# Patient Record
Sex: Female | Born: 1935 | Race: White | Hispanic: No | Smoking: Former smoker
Health system: Southern US, Community
[De-identification: ages and names within clinical notes are randomized; demographics above are authoritative.]

## PROBLEM LIST (undated history)

## (undated) DIAGNOSIS — I4891 Unspecified atrial fibrillation: Secondary | ICD-10-CM

## (undated) DIAGNOSIS — I10 Essential (primary) hypertension: Secondary | ICD-10-CM

## (undated) DIAGNOSIS — I509 Heart failure, unspecified: Secondary | ICD-10-CM

## (undated) HISTORY — PX: APPENDECTOMY: SHX54

## (undated) HISTORY — PX: PACEMAKER INSERTION: SHX728

## (undated) HISTORY — PX: TONSILLECTOMY: SUR1361

---

## 2013-04-05 ENCOUNTER — Encounter: Payer: Self-pay | Admitting: Podiatry

## 2013-04-05 ENCOUNTER — Ambulatory Visit (INDEPENDENT_AMBULATORY_CARE_PROVIDER_SITE_OTHER): Payer: Medicare Other | Admitting: Podiatry

## 2013-04-05 VITALS — BP 131/82 | HR 75 | Ht 60.0 in | Wt 134.0 lb

## 2013-04-05 DIAGNOSIS — L97509 Non-pressure chronic ulcer of other part of unspecified foot with unspecified severity: Secondary | ICD-10-CM | POA: Insufficient documentation

## 2013-04-05 DIAGNOSIS — M79609 Pain in unspecified limb: Secondary | ICD-10-CM

## 2013-04-05 DIAGNOSIS — B351 Tinea unguium: Secondary | ICD-10-CM

## 2013-04-05 DIAGNOSIS — L84 Corns and callosities: Secondary | ICD-10-CM

## 2013-04-05 DIAGNOSIS — M2040 Other hammer toe(s) (acquired), unspecified foot: Secondary | ICD-10-CM

## 2013-04-05 DIAGNOSIS — L97521 Non-pressure chronic ulcer of other part of left foot limited to breakdown of skin: Secondary | ICD-10-CM

## 2013-04-05 NOTE — Progress Notes (Signed)
79 year old female presents complaining of pain on 2nd toe left due to overlapping with the first toe and corn on top.  Objective: Digital corn pre ulcerative 2nd toe left. Overlapping 1st and 2nd digit left, 2nd digit sitting on top of the first. Severe Hallux valgus with bunion deformity on left. Mild hypertrophic nails x 10. Painful calluses under the 2nd MPJ area bilateral.  Assessment: Pre ulcerative digital corn 2nd left with dorsal erythema. Severe hammer toe 2nd left. Severe HAV with bunion left. Overlapping digits 1st and 2nd left. Pain in closed in shoes.  Plan: Digital lesions debrided and padded. Plantar calluses debrided. Hypertrophic nails debrided. May benefit from tenotomy of the 2nd digit left.

## 2013-04-05 NOTE — Patient Instructions (Signed)
Seen for painful toe 2nd left, and nails and calluses. All lesions debrided and padded 2nd toe left. May benefit from tenotomy of the 2nd toe left.

## 2017-01-31 ENCOUNTER — Emergency Department (HOSPITAL_BASED_OUTPATIENT_CLINIC_OR_DEPARTMENT_OTHER)
Admission: EM | Admit: 2017-01-31 | Discharge: 2017-01-31 | Disposition: A | Discharge status: Home / Self Care | Payer: Medicare Other | Attending: Emergency Medicine | Admitting: Emergency Medicine

## 2017-01-31 ENCOUNTER — Encounter (HOSPITAL_BASED_OUTPATIENT_CLINIC_OR_DEPARTMENT_OTHER): Payer: Self-pay | Admitting: Emergency Medicine

## 2017-01-31 DIAGNOSIS — I110 Hypertensive heart disease with heart failure: Secondary | ICD-10-CM | POA: Diagnosis not present

## 2017-01-31 DIAGNOSIS — I509 Heart failure, unspecified: Secondary | ICD-10-CM | POA: Diagnosis not present

## 2017-01-31 DIAGNOSIS — R040 Epistaxis: Secondary | ICD-10-CM | POA: Diagnosis not present

## 2017-01-31 DIAGNOSIS — Z87891 Personal history of nicotine dependence: Secondary | ICD-10-CM | POA: Diagnosis not present

## 2017-01-31 HISTORY — DX: Heart failure, unspecified: I50.9

## 2017-01-31 HISTORY — DX: Essential (primary) hypertension: I10

## 2017-01-31 HISTORY — DX: Unspecified atrial fibrillation: I48.91

## 2017-01-31 MED ORDER — SILVER NITRATE-POT NITRATE 75-25 % EX MISC
CUTANEOUS | Status: AC
  Filled 2017-01-31: qty 1

## 2017-01-31 MED ORDER — SILVER NITRATE-POT NITRATE 75-25 % EX MISC
1.0000 "application " | Freq: Once | CUTANEOUS | Status: AC
  Administered 2017-01-31: 1 via TOPICAL
  Filled 2017-01-31: qty 1

## 2017-01-31 NOTE — ED Provider Notes (Signed)
[facility]   CSN: [id number] Arrival date & time: 01/31/17  1746     History   Chief Complaint Chief Complaint  Patient presents with  . Epistaxis    HPI Miranda Krueger is a 83 y.o. female.  HPI  83yo female with history of atrial fibrillation on xarelto, CHF, htn presents with concern for epistaxis. Has hx of epistaxis from left nare before and has seen ENT, had silver nitrate cautery performed. Wears O2 at night and believes this is contributor. This episode started at [facility]. Reports initially significant amount of bleeding to the area, it has slowed now.  Tried afrin and constant pressure for 10 minutes without improvement, nose still bleeding from left nare. No trauma  Past Medical History:  Diagnosis Date  . A-fib (HCC)   . CHF (congestive heart failure) (HCC)   . Hypertension     Patient Active Problem List   Diagnosis Date Noted  . Other hammer toe (acquired) 04/05/2013  . Corns and callosities 04/05/2013  . Onychomycosis 04/05/2013  . Ulcer of toe (HCC) 04/05/2013    Past Surgical History:  Procedure Laterality Date  . APPENDECTOMY    . PACEMAKER INSERTION    . TONSILLECTOMY      OB History    No data available       Home Medications    Prior to Admission medications   Medication Sig Start Date End Date Taking? Authorizing Provider  UNKNOWN TO PATIENT    Yes [provider]    Family History No family history on file.  Social History Social History  Substance Use Topics  . Smoking status: Former Games developer  . Smokeless tobacco: Never Used  . Alcohol use Not on file     Allergies   Amaryl [glimepiride]   Review of Systems Review of Systems  Constitutional: Negative for fever.  HENT: Positive for nosebleeds.   Respiratory: Negative for shortness of breath.   Gastrointestinal: Negative for vomiting.     Physical Exam Updated Vital Signs BP 139/83   Pulse 73   Temp 98.3 F (36.8 C) (Oral)   Resp  18   SpO2 93%   Physical Exam  Constitutional: She is oriented to person, place, and time. She appears well-developed and well-nourished. No distress.  HENT:  Head: Normocephalic and atraumatic.  Mouth/Throat: No oropharyngeal exudate.  Bleeding from left nare kiesselbach's, small amount of bleeding on inspection and spots of blood when patient dabbing nose  Eyes: Conjunctivae and EOM are normal.  Neck: Normal range of motion.  Cardiovascular: Normal rate, regular rhythm and intact distal pulses.   Pulmonary/Chest: Effort normal and breath sounds normal. No respiratory distress.  Abdominal: Soft. She exhibits no distension. There is no tenderness. There is no guarding.  Musculoskeletal: She exhibits no edema or tenderness.  Neurological: She is alert and oriented to person, place, and time.  Skin: Skin is warm and dry. No rash noted. She is not diaphoretic. No erythema.  Nursing note and vitals reviewed.    ED Treatments / Results  Labs (all labs ordered are listed, but only abnormal results are displayed) Labs Reviewed - No data to display  EKG  EKG Interpretation None       Radiology No results found.  Procedures .Epistaxis Management Date/Time: 02/01/2017 2:27 PM Performed by: Alvira Monday Authorized by: Alvira Monday   Consent:    Consent obtained:  Verbal   Consent given by:  Patient   Risks discussed:  Pain, nasal  injury and bleeding   Alternatives discussed: pressure, packing. Anesthesia (see MAR for exact dosages):    Anesthesia method:  None Procedure details:    Treatment site:  L anterior   Treatment method:  Silver nitrate   Treatment complexity:  Limited   Treatment episode: initial   Post-procedure details:    Assessment:  Bleeding decreased   Patient tolerance of procedure:  Tolerated well, no immediate complications   (including critical care time)  Medications Ordered in ED Medications  silver nitrate applicators applicator 1  application (1 application Topical Given by Other 01/31/17 1900)     Initial Impression / Assessment and Plan / ED Course  I have reviewed the triage vital signs and the nursing notes.  Pertinent labs & imaging results that were available during my care of the patient were reviewed by me and considered in my medical decision making (see chart for details).     83yo female with history of atrial fibrillation on xarelto, CHF, htn presents with concern for epistaxis.  Patient with very mild epistaxis on arrival to ED, however given she had done afrin/pressure at home discussed options including repeat pressure or silver nitrate. Patient preferred silver nitrate.  Treated with improvement, small area of bleed noted to be present.  Placed pressure over area. On reevaluation it has now stopped bleeding and patient discharged. Discussed epistaxis mangement, gave nose clips, recommend continued afrin and pressure prn.  Final Clinical Impressions(s) / ED Diagnoses   Final diagnoses:  Epistaxis    New Prescriptions Discharge Medication List as of 01/31/2017  7:16 PM       Alvira Monday, MD 02/01/17 1434

## 2017-01-31 NOTE — ED Notes (Signed)
Patient reports taking blood pressure medicine. Patient reports forgetting to take it this morning.

## 2017-01-31 NOTE — ED Notes (Addendum)
Patient called this RN into the room. States that she is bleeding again. She reports that she is not "happy" because she is still having bleeding to her nose. Nasal Clamp reapplied and patient waiting for MD to come back to the room to see her

## 2017-01-31 NOTE — ED Notes (Addendum)
Patient reports blood thinner use at home. Patient reports 4 nosebleeds over the past 6-7 weeks. Patient reports seeing ENT for past three nosebleeds to have vessel cauterized. Patient reports "feeling like blood going back down the throat." Patient reports using 2L of Oxygen [place] at night at home and feels "like it dries her nose out."

## 2017-01-31 NOTE — ED Notes (Signed)
Previous notes at this time documented in error

## 2017-01-31 NOTE — ED Notes (Signed)
Nose clamp applied to nose. Pt's SpO2 88% while pt breathing through mouth. Pt wears O2 at night. Blow by O2 applied and brought SpO2 up to 96%.

## 2017-01-31 NOTE — ED Triage Notes (Signed)
Pt reports nosebleed since 3pm. Bleeding has stopped at this time.

## 2017-11-16 ENCOUNTER — Emergency Department (HOSPITAL_COMMUNITY): Payer: Medicare Other

## 2017-11-16 ENCOUNTER — Inpatient Hospital Stay (HOSPITAL_COMMUNITY)
Admission: EM | Admit: 2017-11-16 | Discharge: 2017-12-16 | DRG: 481 | Disposition: E | Payer: Medicare Other | Attending: Family Medicine | Admitting: Family Medicine

## 2017-11-16 ENCOUNTER — Encounter (HOSPITAL_COMMUNITY): Payer: Self-pay | Admitting: Internal Medicine

## 2017-11-16 DIAGNOSIS — Z79899 Other long term (current) drug therapy: Secondary | ICD-10-CM | POA: Diagnosis not present

## 2017-11-16 DIAGNOSIS — I480 Paroxysmal atrial fibrillation: Secondary | ICD-10-CM | POA: Diagnosis present

## 2017-11-16 DIAGNOSIS — Y92009 Unspecified place in unspecified non-institutional (private) residence as the place of occurrence of the external cause: Secondary | ICD-10-CM

## 2017-11-16 DIAGNOSIS — Z7901 Long term (current) use of anticoagulants: Secondary | ICD-10-CM | POA: Diagnosis not present

## 2017-11-16 DIAGNOSIS — D649 Anemia, unspecified: Secondary | ICD-10-CM | POA: Diagnosis present

## 2017-11-16 DIAGNOSIS — I509 Heart failure, unspecified: Secondary | ICD-10-CM | POA: Diagnosis present

## 2017-11-16 DIAGNOSIS — S72001A Fracture of unspecified part of neck of right femur, initial encounter for closed fracture: Secondary | ICD-10-CM

## 2017-11-16 DIAGNOSIS — I959 Hypotension, unspecified: Secondary | ICD-10-CM | POA: Diagnosis not present

## 2017-11-16 DIAGNOSIS — Z8679 Personal history of other diseases of the circulatory system: Secondary | ICD-10-CM

## 2017-11-16 DIAGNOSIS — I110 Hypertensive heart disease with heart failure: Secondary | ICD-10-CM | POA: Diagnosis present

## 2017-11-16 DIAGNOSIS — E785 Hyperlipidemia, unspecified: Secondary | ICD-10-CM | POA: Diagnosis present

## 2017-11-16 DIAGNOSIS — Z8673 Personal history of transient ischemic attack (TIA), and cerebral infarction without residual deficits: Secondary | ICD-10-CM

## 2017-11-16 DIAGNOSIS — J8410 Pulmonary fibrosis, unspecified: Secondary | ICD-10-CM | POA: Diagnosis present

## 2017-11-16 DIAGNOSIS — I495 Sick sinus syndrome: Secondary | ICD-10-CM | POA: Diagnosis present

## 2017-11-16 DIAGNOSIS — W19XXXA Unspecified fall, initial encounter: Secondary | ICD-10-CM

## 2017-11-16 DIAGNOSIS — Z888 Allergy status to other drugs, medicaments and biological substances status: Secondary | ICD-10-CM

## 2017-11-16 DIAGNOSIS — Z882 Allergy status to sulfonamides status: Secondary | ICD-10-CM

## 2017-11-16 DIAGNOSIS — J9611 Chronic respiratory failure with hypoxia: Secondary | ICD-10-CM | POA: Diagnosis present

## 2017-11-16 DIAGNOSIS — Z66 Do not resuscitate: Secondary | ICD-10-CM | POA: Diagnosis present

## 2017-11-16 DIAGNOSIS — W01198A Fall on same level from slipping, tripping and stumbling with subsequent striking against other object, initial encounter: Secondary | ICD-10-CM | POA: Diagnosis present

## 2017-11-16 DIAGNOSIS — Z950 Presence of cardiac pacemaker: Secondary | ICD-10-CM

## 2017-11-16 DIAGNOSIS — J9690 Respiratory failure, unspecified, unspecified whether with hypoxia or hypercapnia: Secondary | ICD-10-CM

## 2017-11-16 DIAGNOSIS — Z9981 Dependence on supplemental oxygen: Secondary | ICD-10-CM | POA: Diagnosis not present

## 2017-11-16 DIAGNOSIS — S72141A Displaced intertrochanteric fracture of right femur, initial encounter for closed fracture: Secondary | ICD-10-CM

## 2017-11-16 DIAGNOSIS — D696 Thrombocytopenia, unspecified: Secondary | ICD-10-CM | POA: Diagnosis present

## 2017-11-16 DIAGNOSIS — S72009A Fracture of unspecified part of neck of unspecified femur, initial encounter for closed fracture: Secondary | ICD-10-CM

## 2017-11-16 DIAGNOSIS — Z87891 Personal history of nicotine dependence: Secondary | ICD-10-CM

## 2017-11-16 DIAGNOSIS — M25551 Pain in right hip: Secondary | ICD-10-CM | POA: Diagnosis present

## 2017-11-16 DIAGNOSIS — Z8249 Family history of ischemic heart disease and other diseases of the circulatory system: Secondary | ICD-10-CM

## 2017-11-16 DIAGNOSIS — Z419 Encounter for procedure for purposes other than remedying health state, unspecified: Secondary | ICD-10-CM

## 2017-11-16 LAB — BASIC METABOLIC PANEL
ANION GAP: 9 (ref 5–15)
BUN: 11 mg/dL (ref 8–23)
CALCIUM: 8.7 mg/dL — AB (ref 8.9–10.3)
CO2: 26 mmol/L (ref 22–32)
CREATININE: 0.85 mg/dL (ref 0.44–1.00)
Chloride: 99 mmol/L (ref 98–111)
GFR, EST NON AFRICAN AMERICAN: 59 mL/min — AB (ref >60)
Glucose, Bld: 136 mg/dL — ABNORMAL HIGH (ref 70–99)
Potassium: 4 mmol/L (ref 3.5–5.1)
SODIUM: 134 mmol/L — AB (ref 135–145)

## 2017-11-16 LAB — CBC WITH DIFFERENTIAL/PLATELET
ABS IMMATURE GRANULOCYTES: 0.1 10*3/uL (ref 0.0–0.1)
BASOS ABS: 0 10*3/uL (ref 0.0–0.1)
BASOS PCT: 0 %
EOS ABS: 0.1 10*3/uL (ref 0.0–0.7)
Eosinophils Relative: 1 %
HCT: 35.5 % — ABNORMAL LOW (ref 36.0–46.0)
Hemoglobin: 11.6 g/dL — ABNORMAL LOW (ref 12.0–15.0)
IMMATURE GRANULOCYTES: 1 %
Lymphocytes Relative: 11 %
Lymphs Abs: 1 10*3/uL (ref 0.7–4.0)
MCH: 33.3 pg (ref 26.0–34.0)
MCHC: 32.7 g/dL (ref 30.0–36.0)
MCV: 102 fL — ABNORMAL HIGH (ref 78.0–100.0)
MONOS PCT: 7 %
Monocytes Absolute: 0.7 10*3/uL (ref 0.1–1.0)
NEUTROS ABS: 7.7 10*3/uL (ref 1.7–7.7)
NEUTROS PCT: 80 %
PLATELETS: 148 10*3/uL — AB (ref 150–400)
RBC: 3.48 MIL/uL — AB (ref 3.87–5.11)
RDW: 12.6 % (ref 11.5–15.5)
WBC: 9.6 10*3/uL (ref 4.0–10.5)

## 2017-11-16 LAB — PROTIME-INR
INR: 1.49
PROTHROMBIN TIME: 17.9 s — AB (ref 11.4–15.2)

## 2017-11-16 MED ORDER — FENTANYL CITRATE (PF) 100 MCG/2ML IJ SOLN
12.5000 ug | INTRAMUSCULAR | Status: AC | PRN
  Administered 2017-11-16 (×2): 12.5 ug via INTRAVENOUS
  Filled 2017-11-16 (×2): qty 2

## 2017-11-16 MED ORDER — HYDROMORPHONE HCL 1 MG/ML IJ SOLN
0.5000 mg | INTRAMUSCULAR | Status: DC | PRN
  Administered 2017-11-17: 0.5 mg via INTRAVENOUS
  Filled 2017-11-16: qty 1

## 2017-11-16 MED ORDER — SODIUM CHLORIDE 0.9 % IV SOLN
INTRAVENOUS | Status: DC
  Administered 2017-11-16 – 2017-11-17 (×2): via INTRAVENOUS

## 2017-11-16 MED ORDER — ONDANSETRON HCL 4 MG/2ML IJ SOLN
4.0000 mg | Freq: Once | INTRAMUSCULAR | Status: AC
Start: 2017-11-16 — End: 2017-11-16
  Administered 2017-11-16: 4 mg via INTRAVENOUS
  Filled 2017-11-16: qty 2

## 2017-11-16 NOTE — ED Notes (Signed)
ED Provider at bedside. 

## 2017-11-16 NOTE — ED Provider Notes (Signed)
[facility] Provider Note   CSN: [id number] Arrival date & time: 12/12/2017  1847     History   Chief Complaint Chief Complaint  Patient presents with  . Fall  . Leg Injury    HPI Miranda Krueger is a 84 y.o. female.  HPI Presents after fall. Patient seems to recall the fall, but is unclear on some of the events itself. She is accompanied by 2 family members. She acknowledges multiple medical issues including A. fib, takes Xarelto. Reportedly the patient lost balance, fell striking her head against the wall, and then onto the ground. Is unclear if she lost consciousness.  When she does have some pain about the right occiput, but no neck pain, no weakness in her extremities. She does have pain in the right hip, worse with motion and she has not been ambulatory since the fall. Medication provided by EMS, fentanyl improve her condition transiently. Past Medical History:  Diagnosis Date  . A-fib (HCC)   . CHF (congestive heart failure) (HCC)   . Hypertension     Patient Active Problem List   Diagnosis Date Noted  . Other hammer toe (acquired) 04/05/2013  . Corns and callosities 04/05/2013  . Onychomycosis 04/05/2013  . Ulcer of toe (HCC) 04/05/2013    Past Surgical History:  Procedure Laterality Date  . APPENDECTOMY    . PACEMAKER INSERTION    . TONSILLECTOMY       OB History   None      Home Medications    Prior to Admission medications   Medication Sig Start Date End Date Taking? Authorizing Provider  UNKNOWN TO PATIENT     [provider]    Family History No family history on file.  Social History Social History   Tobacco Use  . Smoking status: Former Games developermoker  . Smokeless tobacco: Never Used  Substance Use Topics  . Alcohol use: Not on file  . Drug use: Not on file     Allergies   Amaryl [glimepiride]   Review of Systems Review of Systems  Constitutional:       Per HPI, otherwise negative    HENT:       Per HPI, otherwise negative  Respiratory:       Per HPI, otherwise negative  Cardiovascular:       Per HPI, otherwise negative  Gastrointestinal: Negative for vomiting.  Endocrine:       Negative aside from HPI  Genitourinary:       Neg aside from HPI   Musculoskeletal:       Per HPI, otherwise negative  Skin: Negative.   Neurological: Negative for syncope.     Physical Exam Updated Vital Signs BP 115/69   Pulse 68   Temp 97.8 F (36.6 C) (Oral)   Resp (!) 23   SpO2 97%   Physical Exam  Constitutional: She is oriented to person, place, and time. She appears well-developed and well-nourished. No distress.  HENT:  Head: Normocephalic and atraumatic.  Eyes: Conjunctivae and EOM are normal.  Neck: Full passive range of motion without pain. No spinous process tenderness and no muscular tenderness present. No neck rigidity. Normal range of motion present.  Cardiovascular: Normal rate and regular rhythm.  Pulmonary/Chest: Effort normal and breath sounds normal. No stridor. No respiratory distress.  Abdominal: She exhibits no distension.  Musculoskeletal: She exhibits no edema.  Right hip is externally rotated shortened, tender to palpation and she cannot elevated off the bed.  Knee grossly unremarkable, ankle grossly unremarkable  Neurological: She is alert and oriented to person, place, and time. No cranial nerve deficit.  Skin: Skin is warm and dry.  Psychiatric: She has a normal mood and affect.  Nursing note and vitals reviewed.    ED Treatments / Results  Labs (all labs ordered are listed, but only abnormal results are displayed) Labs Reviewed  BASIC METABOLIC PANEL - Abnormal; Notable for the following components:      Result Value   Sodium 134 (*)    Glucose, Bld 136 (*)    Calcium 8.7 (*)    GFR calc non Af Amer 59 (*)    All other components within normal limits  CBC WITH DIFFERENTIAL/PLATELET - Abnormal; Notable for the following components:    RBC 3.48 (*)    Hemoglobin 11.6 (*)    HCT 35.5 (*)    MCV 102.0 (*)    Platelets 148 (*)    All other components within normal limits  PROTIME-INR - Abnormal; Notable for the following components:   Prothrombin Time 17.9 (*)    All other components within normal limits  TYPE AND SCREEN    EKG EKG Interpretation  Date/Time:  Tuesday 12/01/17 20:46:03 EDT Ventricular Rate:  70 PR Interval:    QRS Duration: 124 QT Interval:  432 QTC Calculation: 460 R Axis:   -61 Text Interpretation:  Atrial-paced complexes Prolonged PR interval Left bundle branch block Abnormal ekg Confirmed by Gerhard Munch [phone]) on Dec 01, 2017 10:17:57 PM   Radiology Dg Chest 1 View  Result Date: 2017/12/01 CLINICAL DATA:  Pain following fall EXAM: CHEST  1 VIEW COMPARISON:  October 28, 2017 FINDINGS: There is fibrotic change in the mid and lower lung zones. There is no frank edema or consolidation. Heart is upper normal in size with pulmonary vascularity normal. Pacemaker leads are attached to the right atrium and right ventricle. No adenopathy. There is lower thoracic dextroscoliosis with thoracolumbar levoscoliosis. There is aortic atherosclerosis as well as left carotid artery calcification. IMPRESSION: Fibrotic change in each lung without edema or consolidation. Stable cardiac silhouette. Pacemaker leads attached to right atrium and right ventricle. There is aortic atherosclerosis as well as left carotid artery calcification. Aortic Atherosclerosis (ICD10-I70.0). Electronically Signed   By: Bretta Bang III M.D.   On: 12-01-17 20:47   Ct Head Wo Contrast  Result Date: Dec 01, 2017 CLINICAL DATA:  Pain following fall EXAM: CT HEAD WITHOUT CONTRAST TECHNIQUE: Contiguous axial images were obtained from the base of the skull through the vertex without intravenous contrast. COMPARISON:  April 03, 2017 FINDINGS: Brain: There is moderate diffuse atrophy. There is no intracranial mass, hemorrhage, extra-axial  fluid collection, or midline shift. There is patchy small vessel disease in the centra semiovale bilaterally. No evident acute infarct. Vascular: There is no appreciable hyperdense vessel. There is calcification in each carotid siphon region. Skull: Bony calvarium appears intact. Sinuses/Orbits: There is opacification in the posterior right sphenoid sinus. There is mucosal thickening in several ethmoid air cells. Other paranasal sinuses are clear. Orbits appear symmetric bilaterally. There is evidence of previous cataract removals bilaterally. Other: Patient is status post mastoidectomies bilaterally. IMPRESSION: Atrophy with patchy periventricular small vessel disease. No acute infarct evident. No mass or hemorrhage. Foci of arterial vascular calcification noted. Areas of paranasal sinus disease, most notably in the posterior right sphenoid sinus. Status post mastoidectomies bilaterally. Electronically Signed   By: Bretta Bang III M.D.   On: 12-01-17 20:13   Dg Hip  Unilat With Pelvis 2-3 Views Right  Result Date: 11/19/2017 CLINICAL DATA:  Pain following fall EXAM: DG HIP (WITH OR WITHOUT PELVIS) 2-3V RIGHT COMPARISON:  December 14, 2015 FINDINGS: Frontal pelvis as well as frontal and lateral right hip images were obtained. There is a comminuted fracture through the intertrochanteric region of the right proximal femur as well as in the proximal right femoral diaphysis. There is varus angulation at the fracture site. There is avulsion of a portion of the lesser trochanter on the right. There are old healed fractures of the left superior pubic ramus and ischium medially. No evident dislocation. There is mild symmetric narrowing of both hip joints. IMPRESSION: 1. Comminuted fracture intertrochanteric femur region on the right with extension into the proximal femoral diaphysis. There is varus angulation at fracture site. 2. Healed fractures of the medial superior pubic ramus and ischium on the left. 3.  Mild  symmetric narrowing both hip joints.  No dislocation. Electronically Signed   By: Bretta Bang III M.D.   On: 11/27/2017 20:46    Procedures Procedures (including critical care time)  Medications Ordered in ED Medications  0.9 %  sodium chloride infusion ( Intravenous New Bag/Given 11/23/2017 1939)  fentaNYL (SUBLIMAZE) injection 12.5 mcg (12.5 mcg Intravenous Given 11/15/2017 2102)  ondansetron (ZOFRAN) injection 4 mg (4 mg Intravenous Given 11/15/2017 2157)     Initial Impression / Assessment and Plan / ED Course  I have reviewed the triage vital signs and the nursing notes.  Pertinent labs & imaging results that were available during my care of the patient were reviewed by me and considered in my medical decision making (see chart for details).   Update: Patient and family aware of substantial findings including evidence for intertrochanteric fracture. Subsequently discussed patient's case with our colleague, Dr. Ophelia Charter, and the patient will be admitted for surgical repair. Given her use of Xarelto, this will not likely occur within the next 12 hours.   10:18 PM Patient remains in similar condition, awaiting admission.  This elderly female presents after fall.  The patient is on Xarelto, but head CT was unremarkable, and she is awake and alert, moving all extremities spontaneously, there is low suspicion for occult intracranial hemorrhage or injury. However, the patient is found to have right intertrochanteric fracture, requiring admission for further evaluation and management.  Final Clinical Impressions(s) / ED Diagnoses   Final diagnoses:  Fall  Hip fracture, right, initial encounter   Gerhard Munch, MD 12/01/2017 2219

## 2017-11-16 NOTE — ED Notes (Signed)
Patient requesting pain meds.  Will notify MD

## 2017-11-16 NOTE — ED Triage Notes (Signed)
Patient arrived via ems, from home, patient fell and hit her head against the wall this evening, she is on Xarelto currently. Per ems she had eye sx this am and her right eye is dilated and blood shot from that. She is AOX4, right leg is rotated slightly. Per ems patient has possible deformity to her hip. Patient also has a pacemaker- ventricular paced.

## 2017-11-16 NOTE — ED Notes (Addendum)
Attempted report x1. Left call back number with RN.

## 2017-11-17 ENCOUNTER — Other Ambulatory Visit: Payer: Self-pay

## 2017-11-17 ENCOUNTER — Encounter (HOSPITAL_COMMUNITY): Payer: Self-pay | Admitting: Internal Medicine

## 2017-11-17 DIAGNOSIS — J8410 Pulmonary fibrosis, unspecified: Secondary | ICD-10-CM

## 2017-11-17 DIAGNOSIS — S72009A Fracture of unspecified part of neck of unspecified femur, initial encounter for closed fracture: Secondary | ICD-10-CM | POA: Diagnosis present

## 2017-11-17 DIAGNOSIS — Z7901 Long term (current) use of anticoagulants: Secondary | ICD-10-CM

## 2017-11-17 DIAGNOSIS — Z8679 Personal history of other diseases of the circulatory system: Secondary | ICD-10-CM

## 2017-11-17 DIAGNOSIS — S72001A Fracture of unspecified part of neck of right femur, initial encounter for closed fracture: Secondary | ICD-10-CM

## 2017-11-17 LAB — SURGICAL PCR SCREEN
MRSA, PCR: POSITIVE — AB
STAPHYLOCOCCUS AUREUS: POSITIVE — AB

## 2017-11-17 MED ORDER — POVIDONE-IODINE 10 % EX SWAB
2.0000 "application " | Freq: Once | CUTANEOUS | Status: AC
  Administered 2017-11-18: 2 via TOPICAL

## 2017-11-17 MED ORDER — PANTOPRAZOLE SODIUM 40 MG PO TBEC
40.0000 mg | DELAYED_RELEASE_TABLET | Freq: Every day | ORAL | Status: DC
  Administered 2017-11-17: 40 mg via ORAL
  Filled 2017-11-17: qty 1

## 2017-11-17 MED ORDER — ENSURE ENLIVE PO LIQD
237.0000 mL | Freq: Three times a day (TID) | ORAL | Status: DC
  Administered 2017-11-17: 237 mL via ORAL

## 2017-11-17 MED ORDER — ATORVASTATIN CALCIUM 40 MG PO TABS
40.0000 mg | ORAL_TABLET | Freq: Every day | ORAL | Status: DC
  Administered 2017-11-17: 40 mg via ORAL
  Filled 2017-11-17: qty 1

## 2017-11-17 MED ORDER — POVIDONE-IODINE 10 % EX SWAB: 2.0000 "application " | Freq: Once | CUTANEOUS | Status: DC

## 2017-11-17 MED ORDER — MUPIROCIN 2 % EX OINT
1.0000 "application " | TOPICAL_OINTMENT | Freq: Two times a day (BID) | CUTANEOUS | Status: DC
  Administered 2017-11-17 (×2): 1 via NASAL
  Filled 2017-11-17 (×2): qty 22

## 2017-11-17 MED ORDER — HYDROCODONE-ACETAMINOPHEN 5-325 MG PO TABS
1.0000 | ORAL_TABLET | Freq: Four times a day (QID) | ORAL | Status: DC | PRN
  Administered 2017-11-17: 1 via ORAL
  Filled 2017-11-17: qty 1

## 2017-11-17 MED ORDER — BIOTIN 5 MG PO TBDP: 5.0000 mg | ORAL_TABLET | Freq: Every day | ORAL | Status: DC

## 2017-11-17 MED ORDER — SODIUM CHLORIDE 0.9 % IV SOLN
INTRAVENOUS | Status: DC
  Administered 2017-11-17: 13:00:00 via INTRAVENOUS

## 2017-11-17 MED ORDER — METOPROLOL TARTRATE 50 MG PO TABS
50.0000 mg | ORAL_TABLET | Freq: Two times a day (BID) | ORAL | Status: DC
  Administered 2017-11-17: 50 mg via ORAL
  Filled 2017-11-17 (×2): qty 1

## 2017-11-17 MED ORDER — CHLORHEXIDINE GLUCONATE 4 % EX LIQD
60.0000 mL | Freq: Once | CUTANEOUS | Status: AC
  Administered 2017-11-18: 4 via TOPICAL
  Filled 2017-11-17: qty 60

## 2017-11-17 MED ORDER — ADULT MULTIVITAMIN W/MINERALS CH: 1.0000 | ORAL_TABLET | Freq: Every day | ORAL | Status: DC

## 2017-11-17 MED ORDER — LORATADINE 10 MG PO TABS
10.0000 mg | ORAL_TABLET | Freq: Every day | ORAL | Status: DC
  Administered 2017-11-17: 10 mg via ORAL
  Filled 2017-11-17: qty 1

## 2017-11-17 MED ORDER — MORPHINE SULFATE (PF) 2 MG/ML IV SOLN
0.5000 mg | INTRAVENOUS | Status: DC | PRN
  Administered 2017-11-17 – 2017-11-18 (×2): 0.5 mg via INTRAVENOUS
  Filled 2017-11-17 (×2): qty 1

## 2017-11-17 MED ORDER — HYDROCODONE-ACETAMINOPHEN 5-325 MG PO TABS
1.0000 | ORAL_TABLET | ORAL | Status: DC | PRN
  Administered 2017-11-17: 1 via ORAL
  Filled 2017-11-17: qty 1

## 2017-11-17 MED ORDER — CITALOPRAM HYDROBROMIDE 20 MG PO TABS
20.0000 mg | ORAL_TABLET | Freq: Every day | ORAL | Status: DC
  Administered 2017-11-17: 20 mg via ORAL
  Filled 2017-11-17: qty 1

## 2017-11-17 MED ORDER — CEFAZOLIN SODIUM-DEXTROSE 2-4 GM/100ML-% IV SOLN
2.0000 g | INTRAVENOUS | Status: DC
  Filled 2017-11-17 (×2): qty 100

## 2017-11-17 NOTE — H&P (Signed)
History and Physical    Miranda FlemingsMargaret Granja WUJ:[id number] DOB: 02-21-1925 DOA: 11/23/2017  PCP: Elijio MilesWeaver, John W., MD  Patient coming from: Home.  Chief Complaint: Fall.  HPI: Miranda Krueger is a 84 y.o. female with history of pulmonary fibrosis, paroxysmal atrial fibrillation, sick sinus syndrome status post pacemaker placement, hyperlipidemia, chronic thrombocytopenia and anemia was brought to the ER after patient had a fall.  Patient had a fall while trying to change her pants.  Had hit her head but denies losing consciousness.  Denies any chest pain or palpitation or shortness of breath.  ED Course: In the ER patient had x-rays done which shows right hip fracture.  CT head is unremarkable.  On-call orthopedic surgeon Dr. Ophelia CharterYates has been consulted patient admitted for further management.  Review of Systems: As per HPI, rest all negative.   Past Medical History:  Diagnosis Date  . A-fib (HCC)   . CHF (congestive heart failure) (HCC)   . Hypertension     Past Surgical History:  Procedure Laterality Date  . APPENDECTOMY    . PACEMAKER INSERTION    . TONSILLECTOMY       reports that she has quit smoking. She has never used smokeless tobacco. Her alcohol and drug histories are not on file.  Allergies  Allergen Reactions  . Fluconazole Swelling    Patient reports swelling of face 2 days after taking diflucan    . Amaryl [Glimepiride] Other (See Comments)    Made skin dry and hair fell out  . Amiodarone Other (See Comments)    Made hair fall out     Family History  Problem Relation Age of Onset  . CAD Father   . Diabetes Mellitus II Neg Hx     Prior to Admission medications   Medication Sig Start Date End Date Taking? Authorizing Provider  OXYGEN Inhale 4 L into the lungs continuous.    Yes [provider]  XARELTO 15 MG TABS tablet Take 15 mg by mouth at bedtime. 10/13/17  Yes [provider]  atorvastatin (LIPITOR) 40 MG tablet Take 40 mg by mouth  daily.    [provider]  Biotin 5 MG TBDP Take 5,000 mcg by mouth daily.    [provider]  cetirizine (ZYRTEC) 10 MG tablet Take 10 mg by mouth daily.    [provider]  Cholecalciferol (VITAMIN D3) 2000 units capsule Take 2,000 Units by mouth daily.    [provider]  citalopram (CELEXA) 20 MG tablet Take 20 mg by mouth daily. 11/02/17   [provider]  Lactobacillus Rhamnosus, GG, (CULTURELLE) CAPS Take 1 capsule by mouth daily.    [provider]  metoprolol tartrate (LOPRESSOR) 25 MG tablet Take 50 mg by mouth 2 (two) times daily. 10/13/17   [provider]  omeprazole (PRILOSEC) 40 MG capsule Take 40 mg by mouth daily. 11/02/17   [provider]    Physical Exam: Vitals:   11/17/2017 2230 11/24/2017 2245 11/27/2017 2300 11/17/17 0009  BP: 118/74 114/74 124/69 134/81  Pulse: 78 71 70 69  Resp:    16  Temp:    97.6 F (36.4 C)  TempSrc:    Oral  SpO2: 96% 95% 94% 91%      Constitutional: Moderately built and nourished. Vitals:   11/26/2017 2230 12/13/2017 2245 11/29/2017 2300 11/17/17 0009  BP: 118/74 114/74 124/69 134/81  Pulse: 78 71 70 69  Resp:    16  Temp:    97.6  F (36.4 C)  TempSrc:    Oral  SpO2: 96% 95% 94% 91%   Eyes: Anicteric no pallor. ENMT: No discharge from the ears eyes nose or mouth. Neck: No mass felt.  No neck rigidity.  No JVD appreciated. Respiratory: No rhonchi or crepitations. Cardiovascular: S1-S2 heard no murmurs appreciated. Abdomen: Soft nontender bowel sounds present. Musculoskeletal: Pain on my right hip. Skin: Chronic skin changes. Neurologic: Alert awake oriented to time place and person.  Moves all extremities. Psychiatric: Appears normal per normal affect.   Labs on Admission: I have personally reviewed following labs and imaging studies  CBC: Recent Labs  Lab 11/29/2017 1930  WBC 9.6  NEUTROABS 7.7  HGB 11.6*  HCT 35.5*  MCV 102.0*  PLT 148*   Basic Metabolic  Panel: Recent Labs  Lab 11/17/2017 1930  NA 134*  K 4.0  CL 99  CO2 26  GLUCOSE 136*  BUN 11  CREATININE 0.85  CALCIUM 8.7*   GFR: CrCl cannot be calculated (Unknown ideal weight.). Liver Function Tests: No results for input(s): AST, ALT, ALKPHOS, BILITOT, PROT, ALBUMIN in the last 168 hours. No results for input(s): LIPASE, AMYLASE in the last 168 hours. No results for input(s): AMMONIA in the last 168 hours. Coagulation Profile: Recent Labs  Lab 11/30/2017 1930  INR 1.49   Cardiac Enzymes: No results for input(s): CKTOTAL, CKMB, CKMBINDEX, TROPONINI in the last 168 hours. BNP (last 3 results) No results for input(s): PROBNP in the last 8760 hours. HbA1C: No results for input(s): HGBA1C in the last 72 hours. CBG: No results for input(s): GLUCAP in the last 168 hours. Lipid Profile: No results for input(s): CHOL, HDL, LDLCALC, TRIG, CHOLHDL, LDLDIRECT in the last 72 hours. Thyroid Function Tests: No results for input(s): TSH, T4TOTAL, FREET4, T3FREE, THYROIDAB in the last 72 hours. Anemia Panel: No results for input(s): VITAMINB12, FOLATE, FERRITIN, TIBC, IRON, RETICCTPCT in the last 72 hours. Urine analysis: No results found for: COLORURINE, APPEARANCEUR, LABSPEC, PHURINE, GLUCOSEU, HGBUR, BILIRUBINUR, KETONESUR, PROTEINUR, UROBILINOGEN, NITRITE, LEUKOCYTESUR Sepsis Labs: @LABRCNTIP (procalcitonin:4,lacticidven:4) )No results found for this or any previous visit (from the past 240 hour(s)).   Radiological Exams on Admission: Dg Chest 1 View  Result Date: 12/05/2017 CLINICAL DATA:  Pain following fall EXAM: CHEST  1 VIEW COMPARISON:  October 28, 2017 FINDINGS: There is fibrotic change in the mid and lower lung zones. There is no frank edema or consolidation. Heart is upper normal in size with pulmonary vascularity normal. Pacemaker leads are attached to the right atrium and right ventricle. No adenopathy. There is lower thoracic dextroscoliosis with thoracolumbar levoscoliosis.  There is aortic atherosclerosis as well as left carotid artery calcification. IMPRESSION: Fibrotic change in each lung without edema or consolidation. Stable cardiac silhouette. Pacemaker leads attached to right atrium and right ventricle. There is aortic atherosclerosis as well as left carotid artery calcification. Aortic Atherosclerosis (ICD10-I70.0). Electronically Signed   By: Bretta Bang III M.D.   On: 11/30/2017 20:47   Ct Head Wo Contrast  Result Date: 12/12/2017 CLINICAL DATA:  Pain following fall EXAM: CT HEAD WITHOUT CONTRAST TECHNIQUE: Contiguous axial images were obtained from the base of the skull through the vertex without intravenous contrast. COMPARISON:  April 03, 2017 FINDINGS: Brain: There is moderate diffuse atrophy. There is no intracranial mass, hemorrhage, extra-axial fluid collection, or midline shift. There is patchy small vessel disease in the centra semiovale bilaterally. No evident acute infarct. Vascular: There is no appreciable hyperdense vessel. There is calcification in each carotid siphon region.  Skull: Bony calvarium appears intact. Sinuses/Orbits: There is opacification in the posterior right sphenoid sinus. There is mucosal thickening in several ethmoid air cells. Other paranasal sinuses are clear. Orbits appear symmetric bilaterally. There is evidence of previous cataract removals bilaterally. Other: Patient is status post mastoidectomies bilaterally. IMPRESSION: Atrophy with patchy periventricular small vessel disease. No acute infarct evident. No mass or hemorrhage. Foci of arterial vascular calcification noted. Areas of paranasal sinus disease, most notably in the posterior right sphenoid sinus. Status post mastoidectomies bilaterally. Electronically Signed   By: Bretta Bang III M.D.   On: Dec 10, 2017 20:13   Dg Hip Unilat With Pelvis 2-3 Views Right  Result Date: 12/10/2017 CLINICAL DATA:  Pain following fall EXAM: DG HIP (WITH OR WITHOUT PELVIS) 2-3V  RIGHT COMPARISON:  December 14, 2015 FINDINGS: Frontal pelvis as well as frontal and lateral right hip images were obtained. There is a comminuted fracture through the intertrochanteric region of the right proximal femur as well as in the proximal right femoral diaphysis. There is varus angulation at the fracture site. There is avulsion of a portion of the lesser trochanter on the right. There are old healed fractures of the left superior pubic ramus and ischium medially. No evident dislocation. There is mild symmetric narrowing of both hip joints. IMPRESSION: 1. Comminuted fracture intertrochanteric femur region on the right with extension into the proximal femoral diaphysis. There is varus angulation at fracture site. 2. Healed fractures of the medial superior pubic ramus and ischium on the left. 3.  Mild symmetric narrowing both hip joints.  No dislocation. Electronically Signed   By: Bretta Bang III M.D.   On: 12/10/17 20:46    EKG: Independently reviewed.  Paced rhythm.  Assessment/Plan Principal Problem:   Closed fracture of right hip (HCC) Active Problems:   H/O sick sinus syndrome   Pulmonary fibrosis (HCC)   On continuous oral anticoagulation   Hip fracture (HCC)    1. Closed right hip fracture status post mechanical fall -given the history of pulmonary fibrosis and other comorbidities patient is at high risk for intermediate risk procedure.  Since patient is on Xarelto and her last dose was more than 24 hours ago at this time orthopedic surgeon is planning to await more 24 hours before doing surgery. 2. Paroxysmal atrial fibrillation holding Xarelto in anticipation of surgery.  Continue metoprolol. 3. History of sick sinus syndrome status post pacemaker placement. 4. Chronic anemia follow CBC. 5. Chronic thrombocytopenia follow CBC.  I have reviewed patient's chart and old labs. 6. Pulmonary fibrosis on home oxygen not in acute distress.   DVT prophylaxis: SCDs. Code Status:  DNR. Family Communication: Patient's daughter. Disposition Plan: Nursing facility. Consults called: Orthopedics. Admission status: Inpatient.   Eduard Clos MD [facility] Pager [phone].  If 7PM-7AM, please contact night-coverage [email or web address] Password [email or web address]  11/17/2017, 1:38 AM

## 2017-11-17 NOTE — Anesthesia Preprocedure Evaluation (Addendum)
Anesthesia Evaluation  Patient identified by MRN, date of birth, ID band Patient awake    Reviewed: Allergy & Precautions, NPO status , Patient's Chart, lab work & pertinent test results, reviewed documented beta blocker date and time   Airway Mallampati: II  TM Distance: >3 FB Neck ROM: Full    Dental  (+) Dental Advisory Given   Pulmonary  oxygen dependent, former smoker,  Pulmonary fibrosis   breath sounds clear to auscultation       Cardiovascular hypertension, Pt. on home beta blockers and Pt. on medications +CHF  + dysrhythmias Atrial Fibrillation + pacemaker  Rhythm:Regular Rate:Normal  EKG - A-paced, V-sensed rhythm with LBBB  '18 TTE (Care Everywhere) - EF 50-55%, Mild MR, Mild TR, Severe pulmonary hypertension.   Neuro/Psych negative neurological ROS  negative psych ROS   GI/Hepatic Neg liver ROS, GERD  Controlled and Medicated,  Endo/Other  negative endocrine ROS  Renal/GU negative Renal ROS  negative genitourinary   Musculoskeletal negative musculoskeletal ROS (+)   Abdominal   Peds  Hematology negative hematology ROS (+)   Anesthesia Other Findings   Reproductive/Obstetrics                            Anesthesia Physical Anesthesia Plan  ASA: IV  Anesthesia Plan: General   Post-op Pain Management:    Induction: Intravenous  PONV Risk Score and Plan: 3 and Treatment may vary due to age or medical condition, Ondansetron and Propofol infusion  Airway Management Planned: LMA  Additional Equipment: None  Intra-op Plan:   Post-operative Plan: Extubation in OR  Informed Consent: I have reviewed the patients History and Physical, chart, labs and discussed the procedure including the risks, benefits and alternatives for the proposed anesthesia with the patient or authorized representative who has indicated his/her understanding and acceptance.   Dental advisory  given  Plan Discussed with: CRNA, Anesthesiologist and Surgeon  Anesthesia Plan Comments: (Lengthy discussion had with patient and daughter at bedside. Xarelto taken less than 72 hours ago, spinal not an option at this time. Surgeon explained increased risks associated with delay of surgical fixation in order to allow time for Xarelto clearance for spinal. Explained risk/benefit of waiting for spinal vs. General anesthesia today, including of bleeding risk and spinal/epidural hematoma, cognitive effects of anesthesia, and effects on pulmonary and cardiovascular systems. After much discussion, patient and daughter opted to proceed with plan for general anesthesia ideally utilizing LMA. )       Anesthesia Quick Evaluation

## 2017-11-17 NOTE — Progress Notes (Signed)
PROGRESS NOTE    Miranda Krueger  ZOX:[id number] DOB: 06-21-1924 DOA: 12/03/2017 PCP: Elijio Miles., MD      Brief Narrative:  Miranda Krueger is a 84 y.o. F with IPF on home O2, pAF on Xarelto, SSS with pacer, and chronic anemia and thrombocytopenia (mild) who presents with hip fracture.     Assessment & Plan:  RIGHT hip fracture Last dose of Xarelto was Monday night. -Continue Vicodin -Bedrest, apply ice,.  Fracture protocol -N.p.o. at midnight tonight -Nutrition consulted -Hold following meds:  -Xarelto   Paroxysmal atrial fibrillation Sick sinus syndrome with pacemaker CHA2DS2-Vasc 6 (history of TIA, HTN, age gender), on Xarelto. -Continue telemetry post-op -Hold Xarelto, restart post-operatively as soon as able -Continue BB  Pulmonary fibrosis  Chronic hypoxic respiratory failure Stable, no active disease. -Continue O2  Chronic anemia Stable, at baseline  Chronic thrombocytopenia Stable at baseline  Hyperlipidemia -Continue statin  Other medications -Continue SSRI    DVT prophylaxis: SCDs, restart Xarelto post-op Code Status: DO NOT RESUSCITATE Family Communication: Daughter at the bedside MDM and disposition Plan: The below labs and imaging reports were reviewed and summarized above.    This is a no charge note, for further details, please see H&P by my partner Dr. Toniann Fail from earlier today.  The patient was admitted with RIGHT hip fracture.  She has no active cardiac disease, and should proceed to surgery.  Due to her age, interstitial lung disease, and atrial fibrillation off anticoagulation, she is at high risk for vascular or pulmonary complications.  Restart anticoagulatuion as soon as able per surgery post-procedure.  Careful attention and supportive care of respiratory status after surgery   Consultants:   Orthopedics  Procedures:   RIGHT hip fixation  Antimicrobials:   None    Subjective: Feeling well.  Some right hip and knee  pain.  No confusion, weakness, dizziness.  No chest pain, palpitations, leg swelling, orthopnea.  No fever, cough.  No respiratory distress.  Objective: Vitals:   11/17/17 0009 11/17/17 0439 11/17/17 1500 11/17/17 1549  BP: 134/81 100/65 126/82   Pulse: 69 71 68   Resp: 16 12 16    Temp: 97.6 F (36.4 C) 97.6 F (36.4 C) 98.2 F (36.8 C)   TempSrc: Oral Oral Oral   SpO2: 91% 91% 94%   Weight:    57.7 kg (127 lb 3.3 oz)  Height:    5' (1.524 m)    Intake/Output Summary (Last 24 hours) at 11/17/2017 1701 Last data filed at 11/17/2017 1500 Gross per 24 hour  Intake 0 ml  Output 200 ml  Net -200 ml   Filed Weights   11/17/17 1549  Weight: 57.7 kg (127 lb 3.3 oz)    Examination: The patient was seen and examined.  She appears comfortable, interactive.     Data Reviewed: I have personally reviewed following labs and imaging studies:  CBC: Recent Labs  Lab 11/22/2017 1930  WBC 9.6  NEUTROABS 7.7  HGB 11.6*  HCT 35.5*  MCV 102.0*  PLT 148*   Basic Metabolic Panel: Recent Labs  Lab 12/07/2017 1930  NA 134*  K 4.0  CL 99  CO2 26  GLUCOSE 136*  BUN 11  CREATININE 0.85  CALCIUM 8.7*   GFR: Estimated Creatinine Clearance: 35 mL/min (by C-G formula based on SCr of 0.85 mg/dL). Liver Function Tests: No results for input(s): AST, ALT, ALKPHOS, BILITOT, PROT, ALBUMIN in the last 168 hours. No results for input(s): LIPASE, AMYLASE in the last 168 hours. No  results for input(s): AMMONIA in the last 168 hours. Coagulation Profile: Recent Labs  Lab 2017/12/16 1930  INR 1.49   Cardiac Enzymes: No results for input(s): CKTOTAL, CKMB, CKMBINDEX, TROPONINI in the last 168 hours. BNP (last 3 results) No results for input(s): PROBNP in the last 8760 hours. HbA1C: No results for input(s): HGBA1C in the last 72 hours. CBG: No results for input(s): GLUCAP in the last 168 hours. Lipid Profile: No results for input(s): CHOL, HDL, LDLCALC, TRIG, CHOLHDL, LDLDIRECT in the last 72  hours. Thyroid Function Tests: No results for input(s): TSH, T4TOTAL, FREET4, T3FREE, THYROIDAB in the last 72 hours. Anemia Panel: No results for input(s): VITAMINB12, FOLATE, FERRITIN, TIBC, IRON, RETICCTPCT in the last 72 hours. Urine analysis: No results found for: COLORURINE, APPEARANCEUR, LABSPEC, PHURINE, GLUCOSEU, HGBUR, BILIRUBINUR, KETONESUR, PROTEINUR, UROBILINOGEN, NITRITE, LEUKOCYTESUR Sepsis Labs: @LABRCNTIP (procalcitonin:4,lacticacidven:4)  ) Recent Results (from the past 240 hour(s))  Surgical PCR screen     Status: Abnormal   Collection Time: 11/17/17  6:04 AM  Result Value Ref Range Status   MRSA, PCR POSITIVE (A) NEGATIVE Final    Comment: RESULT CALLED TO, READ BACK BY AND VERIFIED WITH: Durwin Nora RN 9:45 11/17/17 ([email or web address])    Staphylococcus aureus POSITIVE (A) NEGATIVE Final    Comment: (NOTE) The Xpert SA Assay (FDA approved for NASAL specimens in patients 73 years of age and older), is one component of a comprehensive surveillance program. It is not intended to diagnose infection nor to guide or monitor treatment. Performed at [facility], [address] [place], [place] [address]          Radiology Studies: Dg Chest 1 View  Result Date: 2017-12-16 CLINICAL DATA:  Pain following fall EXAM: CHEST  1 VIEW COMPARISON:  October 28, 2017 FINDINGS: There is fibrotic change in the mid and lower lung zones. There is no frank edema or consolidation. Heart is upper normal in size with pulmonary vascularity normal. Pacemaker leads are attached to the right atrium and right ventricle. No adenopathy. There is lower thoracic dextroscoliosis with thoracolumbar levoscoliosis. There is aortic atherosclerosis as well as left carotid artery calcification. IMPRESSION: Fibrotic change in each lung without edema or consolidation. Stable cardiac silhouette. Pacemaker leads attached to right atrium and right ventricle. There is aortic atherosclerosis as well as left carotid  artery calcification. Aortic Atherosclerosis (ICD10-I70.0). Electronically Signed   By: Bretta Bang III M.D.   On: December 16, 2017 20:47   Ct Head Wo Contrast  Result Date: 2017/12/16 CLINICAL DATA:  Pain following fall EXAM: CT HEAD WITHOUT CONTRAST TECHNIQUE: Contiguous axial images were obtained from the base of the skull through the vertex without intravenous contrast. COMPARISON:  April 03, 2017 FINDINGS: Brain: There is moderate diffuse atrophy. There is no intracranial mass, hemorrhage, extra-axial fluid collection, or midline shift. There is patchy small vessel disease in the centra semiovale bilaterally. No evident acute infarct. Vascular: There is no appreciable hyperdense vessel. There is calcification in each carotid siphon region. Skull: Bony calvarium appears intact. Sinuses/Orbits: There is opacification in the posterior right sphenoid sinus. There is mucosal thickening in several ethmoid air cells. Other paranasal sinuses are clear. Orbits appear symmetric bilaterally. There is evidence of previous cataract removals bilaterally. Other: Patient is status post mastoidectomies bilaterally. IMPRESSION: Atrophy with patchy periventricular small vessel disease. No acute infarct evident. No mass or hemorrhage. Foci of arterial vascular calcification noted. Areas of paranasal sinus disease, most notably in the posterior right sphenoid sinus. Status post mastoidectomies bilaterally. Electronically  Signed   By: Bretta BangWilliam  Woodruff III M.D.   On: 12/15/2017 20:13   Dg Hip Unilat With Pelvis 2-3 Views Right  Result Date: 12/04/2017 CLINICAL DATA:  Pain following fall EXAM: DG HIP (WITH OR WITHOUT PELVIS) 2-3V RIGHT COMPARISON:  December 14, 2015 FINDINGS: Frontal pelvis as well as frontal and lateral right hip images were obtained. There is a comminuted fracture through the intertrochanteric region of the right proximal femur as well as in the proximal right femoral diaphysis. There is varus angulation at  the fracture site. There is avulsion of a portion of the lesser trochanter on the right. There are old healed fractures of the left superior pubic ramus and ischium medially. No evident dislocation. There is mild symmetric narrowing of both hip joints. IMPRESSION: 1. Comminuted fracture intertrochanteric femur region on the right with extension into the proximal femoral diaphysis. There is varus angulation at fracture site. 2. Healed fractures of the medial superior pubic ramus and ischium on the left. 3.  Mild symmetric narrowing both hip joints.  No dislocation. Electronically Signed   By: Bretta BangWilliam  Woodruff III M.D.   On: 11/25/2017 20:46        Scheduled Meds: . atorvastatin  40 mg Oral Daily  . chlorhexidine  60 mL Topical Once  . citalopram  20 mg Oral Daily  . feeding supplement (ENSURE ENLIVE)  237 mL Oral TID BM  . loratadine  10 mg Oral Daily  . metoprolol tartrate  50 mg Oral BID  . [START ON 12/04/2017] multivitamin with minerals  1 tablet Oral Daily  . mupirocin ointment  1 application Nasal BID  . pantoprazole  40 mg Oral Daily  . [START ON 12/01/2017] povidone-iodine  2 application Topical Once   Continuous Infusions: . sodium chloride 100 mL/hr at 11/17/17 1325  . [START ON 11/17/2017]  ceFAZolin (ANCEF) IV       LOS: 1 day    Time spent: 25 minutes    Alberteen Samhristopher P Barnes Florek, MD [facility] 11/17/2017, 5:01 PM     Pager [phone] --- please page though AMION:  [email or web address] Password [email or web address] If 7PM-7AM, please contact night-coverage

## 2017-11-17 NOTE — Progress Notes (Signed)
Initial Nutrition Assessment  DOCUMENTATION CODES:   Not applicable  INTERVENTION:   -Ensure Enlive po TID, each supplement provides 350 kcal and 20 grams of protein -MVI with minerals daily  NUTRITION DIAGNOSIS:   Inadequate oral intake related to decreased appetite as evidenced by meal completion < 25%, per patient/family report.  GOAL:   Patient will meet greater than or equal to 90% of their needs  MONITOR:   PO intake, Supplement acceptance, Diet advancement, Labs, Weight trends, Skin, I & O's  REASON FOR ASSESSMENT:   Consult Hip fracture protocol  ASSESSMENT:   Miranda Krueger is a 84 y.o. female with history of pulmonary fibrosis, paroxysmal atrial fibrillation, sick sinus syndrome status post pacemaker placement, hyperlipidemia, chronic thrombocytopenia and anemia was brought to the ER after patient had a fall.   Pt admitted with closed rt hip fx s/p mechanical fall. Per H&P, orthopedics consult pending; sx will likely be postponed related to Xarelto.   Case discussed with RN prior to visit, who reports pt with minimal intake at breakfast. RN just provided pt with Ensure supplement to drink. Plan to go to OR later on today.   Spoke with pt and daughter at bedside. Per pt daughter, pt lives with another daughter, who assist with meal preparation. Pt's appetite varies greatly, sometimes she will eat well and other times she will eat very little. Pt is a selective eater- favorite foods include fresh vegetables from the garden, tomato sandwich, donuts, chick-fil-a, and [employer] cheeseburgers. Pt usually consumes 2-3 meals per day.   Pt and daughter report a progressive wt loss over the past 2 years, related to a pelvic fracture. Reviewed records form [facility]; pt was 113# on 08/18/17. This is favorable given hx of wt loss. Per daughter, pt lowest wt was 114#.   Discussed importance of good meal and supplement intake to promote healing. Pt amenable to continue Ensure  supplements.   Labs reviewed: Na: 134.   NUTRITION - FOCUSED PHYSICAL EXAM:    Most Recent Value  Orbital Region  No depletion  Upper Arm Region  Moderate depletion  Thoracic and Lumbar Region  No depletion  Buccal Region  No depletion  Temple Region  No depletion  Clavicle Bone Region  No depletion  Clavicle and Acromion Bone Region  No depletion  Scapular Bone Region  No depletion  Dorsal Hand  Mild depletion  Patellar Region  Mild depletion  Anterior Thigh Region  Mild depletion  Posterior Calf Region  Mild depletion  Edema (RD Assessment)  Mild  Hair  Reviewed  Eyes  Reviewed  Mouth  Reviewed  Skin  Reviewed  Nails  Reviewed       Diet Order:   Diet Order           Diet NPO time specified  Diet effective midnight        Diet NPO time specified Except for: Sips with Meds  Diet effective midnight        Diet Heart Room service appropriate? Yes; Fluid consistency: Thin  Diet effective now          EDUCATION NEEDS:   Education needs have been addressed  Skin:  Skin Assessment: Reviewed RN Assessment  Last BM:  PTA  Height:   Ht Readings from Last 1 Encounters:  11/17/17 5' (1.524 m)    Weight:   Wt Readings from Last 1 Encounters:  11/17/17 127 lb 3.3 oz (57.7 kg)    Ideal Body Weight:  45.5 kg  BMI:  Body mass index is 24.84 kg/m.  Estimated Nutritional Needs:   Kcal:  1450-1650  Protein:  60-75 grams  Fluid:  1.4-1.6 L    Oluwatoni Rotunno A. Mayford KnifeWilliams, RD, LDN, CDE Pager: [phone] After hours Pager: [phone]

## 2017-11-17 NOTE — Progress Notes (Signed)
1230 Pt has not voided since last nite, bladder is soft,  bladder scanned for 197 ml only. Dr Maryfrances Bunnellanford notified. IVF restarted.

## 2017-11-17 NOTE — Consult Note (Signed)
Reason for Consult:Right hip fx Referring Physician: C Danford  Miranda Krueger is an 84 y.o. female.  HPI: Miranda Krueger was at home with her daughter and tried to get up by herself. She's not sure exactly what happened but ended up falling. She had immediate right hip pain and could not get up. She normally ambulates with a RW.  Past Medical History:  Diagnosis Date  . A-fib ([place])   . CHF (congestive heart failure) ([place])   . Hypertension     Past Surgical History:  Procedure Laterality Date  . APPENDECTOMY    . PACEMAKER INSERTION    . TONSILLECTOMY      Family History  Problem Relation Age of Onset  . CAD Father   . Diabetes Mellitus II Neg Hx     Social History:  reports that she has quit smoking. She has never used smokeless tobacco. Her alcohol and drug histories are not on file.  Allergies:  Allergies  Allergen Reactions  . Fluconazole Swelling    Patient reports swelling of face 2 days after taking diflucan    . Amaryl [Glimepiride] Other (See Comments)    Made skin dry and hair fell out  . Amiodarone Other (See Comments)    Made hair fall out     Medications: I have reviewed the patient's current medications.  Results for orders placed or performed during the hospital encounter of 12/09/2017 (from the past 48 hour(s))  Basic metabolic panel     Status: Abnormal   Collection Time: 11/25/2017  7:30 PM  Result Value Ref Range   Sodium 134 (L) 135 - 145 mmol/L   Potassium 4.0 3.5 - 5.1 mmol/L    Comment: HEMOLYSIS AT THIS LEVEL MAY AFFECT RESULT   Chloride 99 98 - 111 mmol/L    Comment: Please note change in reference range.   CO2 26 22 - 32 mmol/L   Glucose, Bld 136 (H) 70 - 99 mg/dL    Comment: Please note change in reference range.   BUN 11 8 - 23 mg/dL    Comment: Please note change in reference range.   Creatinine, Ser 0.85 0.44 - 1.00 mg/dL   Calcium 8.7 (L) 8.9 - 10.3 mg/dL   GFR calc non Af Amer 59 (L) >60 mL/min   GFR calc Af Amer >60 >60 mL/min   Comment: (NOTE) The eGFR has been calculated using the CKD EPI equation. This calculation has not been validated in all clinical situations. eGFR's persistently <60 mL/min signify possible Chronic Kidney Disease.    Anion gap 9 5 - 15    Comment: Performed at [facility] [address] [place], [place] [address]  CBC WITH DIFFERENTIAL     Status: Abnormal   Collection Time: 12/06/2017  7:30 PM  Result Value Ref Range   WBC 9.6 4.0 - 10.5 K/uL   RBC 3.48 (L) 3.87 - 5.11 MIL/uL   Hemoglobin 11.6 (L) 12.0 - 15.0 g/dL   HCT 35.5 (L) 36.0 - 46.0 %   MCV 102.0 (H) 78.0 - 100.0 fL   MCH 33.3 26.0 - 34.0 pg   MCHC 32.7 30.0 - 36.0 g/dL   RDW 12.6 11.5 - 15.5 %   Platelets 148 (L) 150 - 400 K/uL   Neutrophils Relative % 80 %   Neutro Abs 7.7 1.7 - 7.7 K/uL   Lymphocytes Relative 11 %   Lymphs Abs 1.0 0.7 - 4.0 K/uL   Monocytes Relative 7 %   Monocytes Absolute  0.7 0.1 - 1.0 K/uL   Eosinophils Relative 1 %   Eosinophils Absolute 0.1 0.0 - 0.7 K/uL   Basophils Relative 0 %   Basophils Absolute 0.0 0.0 - 0.1 K/uL   Immature Granulocytes 1 %   Abs Immature Granulocytes 0.1 0.0 - 0.1 K/uL    Comment: Performed at [facility] [address], [place], [place] [address]  Protime-INR     Status: Abnormal   Collection Time: 12/12/2017  7:30 PM  Result Value Ref Range   Prothrombin Time 17.9 (H) 11.4 - 15.2 seconds   INR 1.49     Comment: Performed at [facility] [address], [place], [place] [address]  Type and screen [place]     Status: None (Preliminary result)   Collection Time: 12/12/2017  7:30 PM  Result Value Ref Range   ABO/RH(D) A NEG    Antibody Screen NEG    Sample Expiration 11/19/2017    Antibody Identification ANTI I    PT AG Type      POSITIVE FOR A1 ANTIGEN Performed at [facility], [place] [address], [place], [place] [address]    Unit Number [id number]    Blood Component Type RED CELLS,LR    Unit division 00    Status  of Unit ALLOCATED    Transfusion Status OK TO TRANSFUSE    Crossmatch Result COMPATIBLE    Unit Number [id number]    Blood Component Type RED CELLS,LR    Unit division 00    Status of Unit ALLOCATED    Transfusion Status OK TO TRANSFUSE    Crossmatch Result COMPATIBLE     Dg Chest 1 View  Result Date: 12/14/2017 CLINICAL DATA:  Pain following fall EXAM: CHEST  1 VIEW COMPARISON:  October 28, 2017 FINDINGS: There is fibrotic change in the mid and lower lung zones. There is no frank edema or consolidation. Heart is upper normal in size with pulmonary vascularity normal. Pacemaker leads are attached to the right atrium and right ventricle. No adenopathy. There is lower thoracic dextroscoliosis with thoracolumbar levoscoliosis. There is aortic atherosclerosis as well as left carotid artery calcification. IMPRESSION: Fibrotic change in each lung without edema or consolidation. Stable cardiac silhouette. Pacemaker leads attached to right atrium and right ventricle. There is aortic atherosclerosis as well as left carotid artery calcification. Aortic Atherosclerosis (ICD10-I70.0). Electronically Signed   By: Lowella Grip III M.D.   On: 11/22/2017 20:47   Ct Head Wo Contrast  Result Date: 11/28/2017 CLINICAL DATA:  Pain following fall EXAM: CT HEAD WITHOUT CONTRAST TECHNIQUE: Contiguous axial images were obtained from the base of the skull through the vertex without intravenous contrast. COMPARISON:  April 03, 2017 FINDINGS: Brain: There is moderate diffuse atrophy. There is no intracranial mass, hemorrhage, extra-axial fluid collection, or midline shift. There is patchy small vessel disease in the centra semiovale bilaterally. No evident acute infarct. Vascular: There is no appreciable hyperdense vessel. There is calcification in each carotid siphon region. Skull: Bony calvarium appears intact. Sinuses/Orbits: There is opacification in the posterior right sphenoid sinus. There is mucosal thickening in  several ethmoid air cells. Other paranasal sinuses are clear. Orbits appear symmetric bilaterally. There is evidence of previous cataract removals bilaterally. Other: Patient is status post mastoidectomies bilaterally. IMPRESSION: Atrophy with patchy periventricular small vessel disease. No acute infarct evident. No mass or hemorrhage. Foci of arterial vascular calcification noted. Areas of paranasal sinus disease, most notably in the posterior right  sphenoid sinus. Status post mastoidectomies bilaterally. Electronically Signed   By: Lowella Grip III M.D.   On: 12/05/2017 20:13   Dg Hip Unilat With Pelvis 2-3 Views Right  Result Date: 12/14/2017 CLINICAL DATA:  Pain following fall EXAM: DG HIP (WITH OR WITHOUT PELVIS) 2-3V RIGHT COMPARISON:  December 14, 2015 FINDINGS: Frontal pelvis as well as frontal and lateral right hip images were obtained. There is a comminuted fracture through the intertrochanteric region of the right proximal femur as well as in the proximal right femoral diaphysis. There is varus angulation at the fracture site. There is avulsion of a portion of the lesser trochanter on the right. There are old healed fractures of the left superior pubic ramus and ischium medially. No evident dislocation. There is mild symmetric narrowing of both hip joints. IMPRESSION: 1. Comminuted fracture intertrochanteric femur region on the right with extension into the proximal femoral diaphysis. There is varus angulation at fracture site. 2. Healed fractures of the medial superior pubic ramus and ischium on the left. 3.  Mild symmetric narrowing both hip joints.  No dislocation. Electronically Signed   By: Lowella Grip III M.D.   On: 11/20/2017 20:46    Review of Systems  Constitutional: Negative for weight loss.  HENT: Negative for ear discharge, ear pain, hearing loss and tinnitus.   Eyes: Negative for blurred vision, double vision, photophobia and pain.  Respiratory: Negative for cough, sputum  production and shortness of breath.   Cardiovascular: Negative for chest pain.  Gastrointestinal: Negative for abdominal pain, nausea and vomiting.  Genitourinary: Negative for dysuria, flank pain, frequency and urgency.  Musculoskeletal: Positive for joint pain (Right hip). Negative for back pain, falls, myalgias and neck pain.  Neurological: Negative for dizziness, tingling, sensory change, focal weakness, loss of consciousness and headaches.  Endo/Heme/Allergies: Does not bruise/bleed easily.  Psychiatric/Behavioral: Negative for depression, memory loss and substance abuse. The patient is not nervous/anxious.    Blood pressure 100/65, pulse 71, temperature 97.6 F (36.4 C), temperature source Oral, resp. rate 12, SpO2 91 %. Physical Exam  Constitutional: She appears well-developed and well-nourished. No distress.  HENT:  Head: Normocephalic and atraumatic.  Eyes: Conjunctivae are normal. Right eye exhibits no discharge. Left eye exhibits no discharge. No scleral icterus.  Neck: Normal range of motion.  Cardiovascular: Normal rate and regular rhythm.  Respiratory: Effort normal. No respiratory distress.  Musculoskeletal:  RLE No traumatic wounds, ecchymosis, or rash  TTP lateral thigh  No knee or ankle effusion  Knee stable to varus/ valgus and anterior/posterior stress  Sens TN intact, DPN, SPN absent?  Motor EHL, ext, flex, evers 5/5  DP 2+, PT 0, No significant edema  Neurological: She is alert.  Skin: Skin is warm and dry. She is not diaphoretic.  Psychiatric: She has a normal mood and affect. Her behavior is normal.    Assessment/Plan: Right subtrochanteric femur fx -- For IMN once Xarelto clears, likely tomorrow or Friday. Will make NPO after MN as a precaution. Dr. Marlou Sa to evaluate and will likely do surgery. Afib on Xarelto -- Last took Monday night Multiple medical problems including sick sinus syndrome s/p pacemaker, hyperlipidemia, pulmonary fibrosis, anemia, and  thrombocytopenia -- per primary service    Lisette Abu, PA-C Orthopedic Surgery [phone] 11/17/2017, 9:37 AM

## 2017-11-18 ENCOUNTER — Inpatient Hospital Stay (HOSPITAL_COMMUNITY): Payer: Medicare Other | Admitting: Anesthesiology

## 2017-11-18 ENCOUNTER — Inpatient Hospital Stay (HOSPITAL_COMMUNITY): Payer: Medicare Other

## 2017-11-18 ENCOUNTER — Encounter (HOSPITAL_COMMUNITY): Admission: EM | Disposition: E | Payer: Self-pay | Source: Home / Self Care | Attending: Family Medicine

## 2017-11-18 DIAGNOSIS — S72141A Displaced intertrochanteric fracture of right femur, initial encounter for closed fracture: Secondary | ICD-10-CM

## 2017-11-18 HISTORY — PX: FEMUR IM NAIL: SHX1597

## 2017-11-18 LAB — POCT I-STAT 4, (NA,K, GLUC, HGB,HCT)
GLUCOSE: 156 mg/dL — AB (ref 70–99)
HEMATOCRIT: 29 % — AB (ref 36.0–46.0)
HEMOGLOBIN: 9.9 g/dL — AB (ref 12.0–15.0)
POTASSIUM: 4.2 mmol/L (ref 3.5–5.1)
SODIUM: 135 mmol/L (ref 135–145)

## 2017-11-18 LAB — BASIC METABOLIC PANEL
ANION GAP: 7 (ref 5–15)
BUN: 19 mg/dL (ref 8–23)
CALCIUM: 8.8 mg/dL — AB (ref 8.9–10.3)
CO2: 29 mmol/L (ref 22–32)
Chloride: 100 mmol/L (ref 98–111)
Creatinine, Ser: 0.8 mg/dL (ref 0.44–1.00)
GFR calc Af Amer: >60 mL/min (ref >60)
Glucose, Bld: 146 mg/dL — ABNORMAL HIGH (ref 70–99)
POTASSIUM: 4.3 mmol/L (ref 3.5–5.1)
SODIUM: 136 mmol/L (ref 135–145)

## 2017-11-18 LAB — CBC
HCT: 32.3 % — ABNORMAL LOW (ref 36.0–46.0)
Hemoglobin: 10.4 g/dL — ABNORMAL LOW (ref 12.0–15.0)
MCH: 32.7 pg (ref 26.0–34.0)
MCHC: 32.2 g/dL (ref 30.0–36.0)
MCV: 101.6 fL — AB (ref 78.0–100.0)
PLATELETS: 86 10*3/uL — AB (ref 150–400)
RBC: 3.18 MIL/uL — ABNORMAL LOW (ref 3.87–5.11)
RDW: 12.9 % (ref 11.5–15.5)
WBC: 11.9 10*3/uL — AB (ref 4.0–10.5)

## 2017-11-18 LAB — GLUCOSE, CAPILLARY: Glucose-Capillary: 154 mg/dL — ABNORMAL HIGH (ref 70–99)

## 2017-11-18 SURGERY — INSERTION, INTRAMEDULLARY ROD, FEMUR
Anesthesia: General | Site: Leg Upper | Laterality: Right

## 2017-11-18 MED ORDER — PHENYLEPHRINE HCL 10 MG/ML IJ SOLN
INTRAVENOUS | Status: DC | PRN
  Administered 2017-11-18: 80 ug/min via INTRAVENOUS
  Administered 2017-11-18: 75 ug/min via INTRAVENOUS

## 2017-11-18 MED ORDER — EPHEDRINE 5 MG/ML INJ
5.0000 mg | INTRAVENOUS | Status: AC | PRN
  Administered 2017-11-18 (×2): 5 mg via INTRAVENOUS

## 2017-11-18 MED ORDER — LACTATED RINGERS IV SOLN
INTRAVENOUS | Status: DC | PRN
  Administered 2017-11-18: 08:00:00 via INTRAVENOUS

## 2017-11-18 MED ORDER — MORPHINE SULFATE (PF) 4 MG/ML IV SOLN
INTRAVENOUS | Status: AC
  Filled 2017-11-18: qty 1

## 2017-11-18 MED ORDER — FENTANYL CITRATE (PF) 100 MCG/2ML IJ SOLN
INTRAMUSCULAR | Status: DC | PRN
  Administered 2017-11-18 (×2): 25 ug via INTRAVENOUS

## 2017-11-18 MED ORDER — TRANEXAMIC ACID 1000 MG/10ML IV SOLN
2000.0000 mg | Freq: Once | INTRAVENOUS | Status: DC
  Filled 2017-11-18: qty 20

## 2017-11-18 MED ORDER — CLONIDINE HCL (ANALGESIA) 100 MCG/ML EP SOLN
EPIDURAL | Status: DC | PRN
  Administered 2017-11-18: .5 mL via INTRA_ARTICULAR

## 2017-11-18 MED ORDER — PHENYLEPHRINE 40 MCG/ML (10ML) SYRINGE FOR IV PUSH (FOR BLOOD PRESSURE SUPPORT)
PREFILLED_SYRINGE | INTRAVENOUS | Status: DC | PRN
  Administered 2017-11-18: 80 ug via INTRAVENOUS
  Administered 2017-11-18: 40 ug via INTRAVENOUS

## 2017-11-18 MED ORDER — ONDANSETRON HCL 4 MG/2ML IJ SOLN: 4.0000 mg | Freq: Once | INTRAMUSCULAR | Status: DC | PRN

## 2017-11-18 MED ORDER — ALBUMIN HUMAN 5 % IV SOLN
12.5000 g | Freq: Once | INTRAVENOUS | Status: AC
  Administered 2017-11-18: 12.5 g via INTRAVENOUS

## 2017-11-18 MED ORDER — ALBUMIN HUMAN 5 % IV SOLN
INTRAVENOUS | Status: AC
  Administered 2017-11-18: 12.5 g via INTRAVENOUS
  Filled 2017-11-18: qty 250

## 2017-11-18 MED ORDER — EPHEDRINE SULFATE 50 MG/ML IJ SOLN
INTRAMUSCULAR | Status: AC
  Filled 2017-11-18: qty 1

## 2017-11-18 MED ORDER — MORPHINE SULFATE 4 MG/ML IJ SOLN
INTRAMUSCULAR | Status: DC | PRN
  Administered 2017-11-18: 09:00:00 via INTRA_ARTICULAR

## 2017-11-18 MED ORDER — PROPOFOL 500 MG/50ML IV EMUL
INTRAVENOUS | Status: DC | PRN
  Administered 2017-11-18: 25 ug/kg/min via INTRAVENOUS

## 2017-11-18 MED ORDER — PROPOFOL 10 MG/ML IV BOLUS
INTRAVENOUS | Status: AC
  Filled 2017-11-18: qty 20

## 2017-11-18 MED ORDER — VANCOMYCIN HCL IN DEXTROSE 1-5 GM/200ML-% IV SOLN
INTRAVENOUS | Status: AC
  Filled 2017-11-18: qty 200

## 2017-11-18 MED ORDER — BUPIVACAINE HCL (PF) 0.25 % IJ SOLN
INTRAMUSCULAR | Status: AC
  Filled 2017-11-18: qty 20

## 2017-11-18 MED ORDER — VANCOMYCIN HCL 1000 MG IV SOLR
INTRAVENOUS | Status: DC | PRN
  Administered 2017-11-18: 1000 mg via INTRAVENOUS

## 2017-11-18 MED ORDER — FENTANYL CITRATE (PF) 250 MCG/5ML IJ SOLN
INTRAMUSCULAR | Status: AC
Start: 2017-11-18 — End: ?
  Filled 2017-11-18: qty 5

## 2017-11-18 MED ORDER — LIDOCAINE HCL (CARDIAC) PF 100 MG/5ML IV SOSY
PREFILLED_SYRINGE | INTRAVENOUS | Status: DC | PRN
  Administered 2017-11-18: 100 mg via INTRAVENOUS

## 2017-11-18 MED ORDER — OXYCODONE HCL 5 MG PO TABS: 5.0000 mg | ORAL_TABLET | Freq: Once | ORAL | Status: DC | PRN

## 2017-11-18 MED ORDER — CLONIDINE HCL (ANALGESIA) 100 MCG/ML EP SOLN
150.0000 ug | Freq: Once | EPIDURAL | Status: DC
  Filled 2017-11-18 (×2): qty 10

## 2017-11-18 MED ORDER — PHENYLEPHRINE HCL-NACL 10-0.9 MG/250ML-% IV SOLN
0.0000 ug/min | INTRAVENOUS | Status: DC
  Administered 2017-11-18: 20 ug/min via INTRAVENOUS
  Filled 2017-11-18 (×2): qty 250

## 2017-11-18 MED ORDER — OXYCODONE HCL 5 MG/5ML PO SOLN: 5.0000 mg | Freq: Once | ORAL | Status: DC | PRN

## 2017-11-18 MED ORDER — 0.9 % SODIUM CHLORIDE (POUR BTL) OPTIME
TOPICAL | Status: DC | PRN
  Administered 2017-11-18: 1000 mL

## 2017-11-18 MED ORDER — FENTANYL CITRATE (PF) 100 MCG/2ML IJ SOLN: 25.0000 ug | INTRAMUSCULAR | Status: DC | PRN

## 2017-11-18 MED ORDER — PROPOFOL 10 MG/ML IV BOLUS
INTRAVENOUS | Status: DC | PRN
  Administered 2017-11-18: 10 mg via INTRAVENOUS
  Administered 2017-11-18: 70 mg via INTRAVENOUS

## 2017-11-18 MED ORDER — VASOPRESSIN 20 UNIT/ML IV SOLN
INTRAVENOUS | Status: AC
  Filled 2017-11-18: qty 1

## 2017-11-18 MED ORDER — ONDANSETRON HCL 4 MG/2ML IJ SOLN
INTRAMUSCULAR | Status: DC | PRN
  Administered 2017-11-18: 4 mg via INTRAVENOUS

## 2017-11-18 SURGICAL SUPPLY — 52 items
BIT DRILL SHORT 4.0 (BIT) ×2 IMPLANT
BLADE CLIPPER SURG (BLADE) IMPLANT
BLADE SURG 15 STRL LF DISP TIS (BLADE) IMPLANT
BLADE SURG 15 STRL SS (BLADE)
COVER PERINEAL POST (MISCELLANEOUS) ×3 IMPLANT
COVER SURGICAL LIGHT HANDLE (MISCELLANEOUS) ×3 IMPLANT
DRAPE HALF SHEET 40X57 (DRAPES) IMPLANT
DRAPE ORTHO SPLIT 77X108 STRL (DRAPES)
DRAPE STERI IOBAN 125X83 (DRAPES) ×3 IMPLANT
DRAPE SURG ORHT 6 SPLT 77X108 (DRAPES) IMPLANT
DRILL BIT SHORT 4.0 (BIT) ×4
DRSG AQUACEL AG ADV 3.5X 4 (GAUZE/BANDAGES/DRESSINGS) ×6 IMPLANT
DRSG AQUACEL AG ADV 3.5X 6 (GAUZE/BANDAGES/DRESSINGS) ×3 IMPLANT
DRSG MEPILEX BORDER 4X4 (GAUZE/BANDAGES/DRESSINGS) IMPLANT
DRSG MEPILEX BORDER 4X8 (GAUZE/BANDAGES/DRESSINGS) IMPLANT
DURAPREP 26ML APPLICATOR (WOUND CARE) ×3 IMPLANT
ELECT REM PT RETURN 9FT ADLT (ELECTROSURGICAL) ×3
ELECTRODE REM PT RTRN 9FT ADLT (ELECTROSURGICAL) ×1 IMPLANT
FACESHIELD WRAPAROUND (MASK) ×3 IMPLANT
GAUZE XEROFORM 5X9 LF (GAUZE/BANDAGES/DRESSINGS) IMPLANT
GLOVE BIOGEL PI IND STRL 8 (GLOVE) ×1 IMPLANT
GLOVE BIOGEL PI INDICATOR 8 (GLOVE) ×2
GLOVE SURG ORTHO 8.0 STRL STRW (GLOVE) ×3 IMPLANT
GOWN STRL REUS W/ TWL LRG LVL3 (GOWN DISPOSABLE) ×1 IMPLANT
GOWN STRL REUS W/ TWL XL LVL3 (GOWN DISPOSABLE) IMPLANT
GOWN STRL REUS W/TWL LRG LVL3 (GOWN DISPOSABLE) ×2
GOWN STRL REUS W/TWL XL LVL3 (GOWN DISPOSABLE)
GUIDE PIN 3.2X343 (PIN) ×2
GUIDE PIN 3.2X343MM (PIN) ×4
KIT BASIN OR (CUSTOM PROCEDURE TRAY) ×3 IMPLANT
KIT TURNOVER KIT B (KITS) ×3 IMPLANT
LINER BOOT UNIVERSAL DISP (MISCELLANEOUS) ×3 IMPLANT
MANIFOLD NEPTUNE II (INSTRUMENTS) ×3 IMPLANT
NAIL TRIGEN 10MMX36CM-125 RT (Nail) ×3 IMPLANT
NS IRRIG 1000ML POUR BTL (IV SOLUTION) ×3 IMPLANT
PACK GENERAL/GYN (CUSTOM PROCEDURE TRAY) ×3 IMPLANT
PAD ARMBOARD 7.5X6 YLW CONV (MISCELLANEOUS) ×6 IMPLANT
PIN GUIDE 3.2X343MM (PIN) ×2 IMPLANT
SCREW LAG COMPR KIT 90/85 (Screw) ×3 IMPLANT
SCREW TRIGEN LOW PROF 5.0X40 (Screw) ×3 IMPLANT
SCREW TRIGEN LOW PROF 5.0X45 (Screw) ×3 IMPLANT
SPONGE LAP 4X18 RFD (DISPOSABLE) IMPLANT
STAPLER VISISTAT 35W (STAPLE) ×3 IMPLANT
SUT ETHILON 2 0 FS 18 (SUTURE) ×3 IMPLANT
SUT ETHILON 3 0 PS 1 (SUTURE) ×12 IMPLANT
SUT VIC AB 0 CT1 27 (SUTURE) ×6
SUT VIC AB 0 CT1 27XBRD ANBCTR (SUTURE) ×3 IMPLANT
SUT VIC AB 2-0 CTB1 (SUTURE) ×3 IMPLANT
TAPE STRIPS DRAPE STRL (GAUZE/BANDAGES/DRESSINGS) IMPLANT
TOWEL OR 17X24 6PK STRL BLUE (TOWEL DISPOSABLE) ×3 IMPLANT
TOWEL OR 17X26 10 PK STRL BLUE (TOWEL DISPOSABLE) ×3 IMPLANT
WATER STERILE IRR 1000ML POUR (IV SOLUTION) ×3 IMPLANT

## 2017-11-19 ENCOUNTER — Encounter (HOSPITAL_COMMUNITY): Payer: Self-pay | Admitting: Orthopedic Surgery

## 2017-11-19 LAB — BPAM RBC
BLOOD PRODUCT EXPIRATION DATE: 201907112359
Blood Product Expiration Date: 201907112359
ISSUE DATE / TIME: 201906250901
ISSUE DATE / TIME: 201906250901
UNIT TYPE AND RH: 600
UNIT TYPE AND RH: 600

## 2017-11-19 LAB — TYPE AND SCREEN
ABO/RH(D): A NEG
Antibody Screen: NEGATIVE
PT AG Type: POSITIVE
UNIT DIVISION: 0
Unit division: 0

## 2017-12-16 NOTE — Progress Notes (Signed)
   2017-05-28 1600  Clinical Encounter Type  Visited With Patient;Patient and family together  Visit Type Initial  Referral From Nurse  Consult/Referral To Chaplain  Spiritual Encounters  Spiritual Needs Emotional;Grief support    This was a page for a 84 yr old caucacian lady who passed. Four of pt's daughter and husband to one of them were present and very tearful. Pt's nurse was present too. Chaplain and pt's family had conversations about the pt. 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 Chaplain provided empathic reflective listening, compassionate presence and prayer.  Ariz Terrones a Water quality scientistMusiko-Holley, E. I. du PontChaplain

## 2017-12-16 NOTE — Progress Notes (Signed)
Patient arrived to unit from PACU at 1305, BP stable on Neo 110 mcg, able to answer questions, + fc. Difficult to secure an appropriate O2 sat reading, blood gas ordered. Pt at 1320 became apneic, PEA with pacemaker continuing to discharge. Unresponsive to noxious stimuli. Absence of respirations and pulslessness witnessed by Devra DoppSteve Minor NP and Holland CommonsMaria RN. Pt was responsive and + fc until witnessed apnea.  Family called immediately to bedside and events explained. Emotional support provided to family and reassurance that patient experienced no anxiety/difficultly. Family functionally grieving and expressed gratitude that their mother was comfortable in passing.   Pacemaker continued discharging with magnet applied over it. [employer] rep notified.

## 2017-12-16 NOTE — Anesthesia Postprocedure Evaluation (Signed)
Anesthesia Post Note  Patient: Miranda FlemingsMargaret Krueger  Procedure(s) Performed: INTRAMEDULLARY (IM) NAIL FEMORAL (Right Leg Upper)     Patient location during evaluation: PACU Anesthesia Type: General Level of consciousness: awake, responds to stimulation and patient cooperative Pain management: pain level controlled Vital Signs Assessment: post-procedure vital signs reviewed and stable Respiratory status: spontaneous breathing, nonlabored ventilation, respiratory function stable and patient connected to nasal cannula oxygen Cardiovascular status: stable Postop Assessment: no apparent nausea or vomiting Anesthetic complications: no Comments: Patient dealing with hypotension in PACU. Baseline SBP 90's-110's. Required phenylephrine infusion intraoperatively, at times high dose, to maintain BP. Postoperatively requiring phenylephrine infusion to maintain SBP > 80, despite IV fluid and albumin given. Ephedrine bolus effective, but short-lived. Will check H+H, though intraoperative blood loss was not significant, doubt anemia as cause. Will require continued vasopressor infusion for now, thus have contacted ICU for a bed.    Last Vitals:  Vitals:   12/07/2017 1125 11/29/2017 1140  BP: (!) 81/63 92/66  Pulse: 86 86  Resp: 19 20  Temp:    SpO2: 93%     Last Pain:  Vitals:   11/17/2017 1008  TempSrc:   PainSc: Asleep        RLE Motor Response: Purposeful movement;Responds to commands (11/30/2017 1140) RLE Sensation: Full sensation (12/10/2017 1140)      Beryle Lathehomas E Nael Petrosyan

## 2017-12-16 NOTE — Death Summary Note (Addendum)
Expiration Note/ Death Summary  Sherron FlemingsMargaret Hopfer  MR#: [id number]  DOB:Apr 18, 1925  Date of Admission: 12/08/2017 Date of Death: 11/22/2017  Attending Physician:Davanee Klinkner P Stevey Stapleton  Patient's PCP: Elijio MilesWeaver, John W., MD  Consults: Treatment Team:  Cammy Copaean, Gregory Scott, MD  Cause of Death: Closed fracture of right hip [facility](HCC)  Secondary Diagnoses Present on Admission: . Pulmonary fibrosis (HCC) . Hip fracture (HCC)   Closed fracture of right hip (HCC) Active Problems:   H/O sick sinus syndrome   Pulmonary fibrosis (HCC)   On continuous oral anticoagulation   Hip fracture (HCC)   Intertrochanteric fracture of right hip, closed, initial encounter (HCC)   Brief H and P: For complete details please refer to admission H and P, but in brief Mrs. Miranda Krueger is a 84 y.o. F with IPF on home O2, pAF on Xarelto, SSS with pacer, and chronic anemia and thrombocytopenia (mild) who presents with hip fracture after a mechanical fall.   Hospital Course: The patient was admitted and allowed Xarelto washout.  She was stable on home O2, had no active cardiac symptoms.  This morning, Hgb was stable and she underwent ORIF of the right hip.  Per discussion with Anesthesiology, patient had some post-induction hypotension that resolved with fluids and phenylephrine, after which she had alternating hypo- and hypertension.  Post-operatively, her LMA was removed, she was waking from anesthesia appropriately, although had persistent hypotension.  This was treated with ephedrine and started on phenylephrine drip and fluids again, and she was transported to the ICU.  On arrival to the ICU, she was interactive, but began to be agitated, then had      ADDENDUM: This is a late entry to complete the above dictation which was truncated by a transcription error.  On arrival to the ICU, she was interactive, but began to be agitated, then became unresponsive.  She had no pulse nor BP nor breathing and was pronounced dead  shortly after.  Specific etiology was unknown, but it was presumed she had sudden cardiac death from large myocardial infarction or from intraoperative hypotension in the context of her advanced pulmonary fibrosis and advanced age.        Signed:  Alberteen Samhristopher P Anahit Klumb M.D. [facility] 11/27/2017, 2:01 PM Pager: [phone]

## 2017-12-16 NOTE — Progress Notes (Signed)
Plan hip fixation today hgb 10 All ? answered

## 2017-12-16 NOTE — Brief Op Note (Signed)
12/03/2017  9:55 AM  PATIENT:  Miranda Krueger  84 y.o. female  PRE-OPERATIVE DIAGNOSIS:  Right hip fx  POST-OPERATIVE DIAGNOSIS:  Right hip fx  PROCEDURE:  Procedure(s): INTRAMEDULLARY (IM) NAIL FEMORAL  SURGEON:  Surgeon(s): Cammy Copaean, Gregory Scott, MD  ASSISTANT: none  ANESTHESIA:   general  EBL: 25 ml    Total I/O In: -  Out: 50 [Blood:50]  BLOOD ADMINISTERED: none  DRAINS: none   LOCAL MEDICATIONS USED:  Marcaine mso4 clonidine  SPECIMEN:  No Specimen  COUNTS:  YES  TOURNIQUET:  * No tourniquets in log *  DICTATION: .Other Dictation: Dictation Number [id number]  PLAN OF CARE: Admit to inpatient   PATIENT DISPOSITION:  PACU - hemodynamically stable

## 2017-12-16 NOTE — Transfer of Care (Signed)
Immediate Anesthesia Transfer of Care Note  Patient: Miranda Krueger  Procedure(s) Performed: INTRAMEDULLARY (IM) NAIL FEMORAL (Right Leg Upper)  Patient Location: PACU  Anesthesia Type:General  Level of Consciousness: awake and alert   Airway & Oxygen Therapy: Patient Spontanous Breathing and Patient connected to face mask oxygen  Post-op Assessment: Report given to RN and Post -op Vital signs reviewed and stable  Post vital signs: Reviewed and stable  Last Vitals:  Vitals Value Taken Time  BP 96/64 11/16/2017 10:08 AM  Temp 36.6 C 12/05/2017 10:08 AM  Pulse 73 12/07/2017 10:13 AM  Resp 15 11/21/2017 10:13 AM  SpO2 100 % 12/15/2017 10:13 AM  Vitals shown include unvalidated device data.  Last Pain:  Vitals:   Sep 20, 2017 1008  TempSrc:   PainSc: (P) Asleep         Complications: No apparent anesthesia complications

## 2017-12-16 NOTE — Anesthesia Procedure Notes (Signed)
Procedure Name: LMA Insertion Date/Time: 12/12/2017 8:08 AM Performed by: Adonis Housekeeperongell, Dorr Perrot M, CRNA Pre-anesthesia Checklist: Patient identified, Emergency Drugs available, Suction available and Patient being monitored Patient Re-evaluated:Patient Re-evaluated prior to induction Oxygen Delivery Method: Circle system utilized Preoxygenation: Pre-oxygenation with 100% oxygen Induction Type: IV induction Ventilation: Mask ventilation without difficulty LMA: LMA inserted LMA Size: 4.0 Placement Confirmation: positive ETCO2 and breath sounds checked- equal and bilateral Tube secured with: Tape Dental Injury: Teeth and Oropharynx as per pre-operative assessment

## 2017-12-16 NOTE — Op Note (Signed)
NAMSherron Krueger: Barrington, [name] MEDICAL RECORD [id number] ACCOUNT [id number] DATE OF BIRTH:[birth year] FACILITY: [facility] LOCATION: [facility] PHYSICIAN:Leiya Keesey Diamantina ProvidenceS. Daril Warga, MD  OPERATIVE REPORT  DATE OF PROCEDURE:  2017/12/18  PREOPERATIVE DIAGNOSIS:  Right hip subtrochanteric intertrochanteric femur fracture.  POSTOPERATIVE DIAGNOSIS:  Right hip subtrochanteric intertrochanteric femur fracture.  PROCEDURE:  Right hip intramedullary hip screw, Smith and Nephew 10 x 36 with compression and lag screw and two distal interlocking screws.  SURGEON:  Cammy CopaGregory Scott Ladan Vanderzanden, MD  ASSISTANT:  None.  ANESTHESIA:  General.  INDICATIONS:  The patient is a 84 year old female who injured her right hip falling at a ground level fall.  The patient presents now for operative management after explanation of risks and benefits.  PROCEDURE IN DETAIL:  The patient was brought to the operating room where general anesthetic was induced.  Preoperative antibiotics were administered.  A timeout was called.  The patient's left leg was placed in the lithotomy position with a peroneal  nerve well padded.  Right hip placed under traction and slight internal rotation.  Good reduction was confirmed in the AP and lateral planes under fluoroscopy.  The right hip region was then prepped, scrubbed with alcohol and Betadine and allowed to air  dry.  Prepped with DuraPrep solution and draped in a sterile manner.  An incision was made 4 fingerbreadths proximal to the trochanter.  Placed across the fracture site.  The greater trochanteric piece was essentially a semi-free fragment.  This piece  was reduced when the guide pin was placed.  Proximal reaming was then performed.  In accordance with preoperative templating a 10 x 36 nail was then placed.  The lag and compression screws were placed and that gave a nice reduction of the fracture.  The  proximal aspect of the nail was about 1 cm proud, but the compression and lag screws were definitely in  the inferior half of the femoral neck.  Good position confirmed in the AP and lateral planes under fluoroscopy.  Two distal interlocking screws were  then placed from lateral to medial with good confirmation of placement in the AP and lateral planes.  At this time, thorough irrigation of all incisions was performed.  The interlocking incisions distally were closed using 2-0 Vicryl and 3-0 nylon.  The  2 proximal incisions were closed using 0 Vicryl suture, 2-0 Vicryl suture and 3-0 nylon.  A solution of Marcaine, morphine and clonidine was injected into the skin of the 2 proximal incisions for postop pain relief.  Aquacel dressing was placed.  The  patient tolerated the procedure well without immediate complications.  He was transferred to the recovery room in stable condition.  TN/NUANCE  D:2017/12/18 T:2017/12/18 JOB:[id number]

## 2017-12-16 DEATH — deceased

## 2020-01-06 IMAGING — RF DG FEMUR 2+V*R*
1 series · 4 of 4 positions shown · non-contrast
Comparison: Right hip radiographs-11/16/2017

CLINICAL DATA: Post intramedullary rod fixation of the right femur.

EXAM:
DG C-ARM 61-120 MIN; RIGHT FEMUR 2 VIEWS
FLUOROSCOPY TIME:  2 minutes, 18 seconds

[Series 1: run · 4 of 4 slices shown]
[im 1/4]
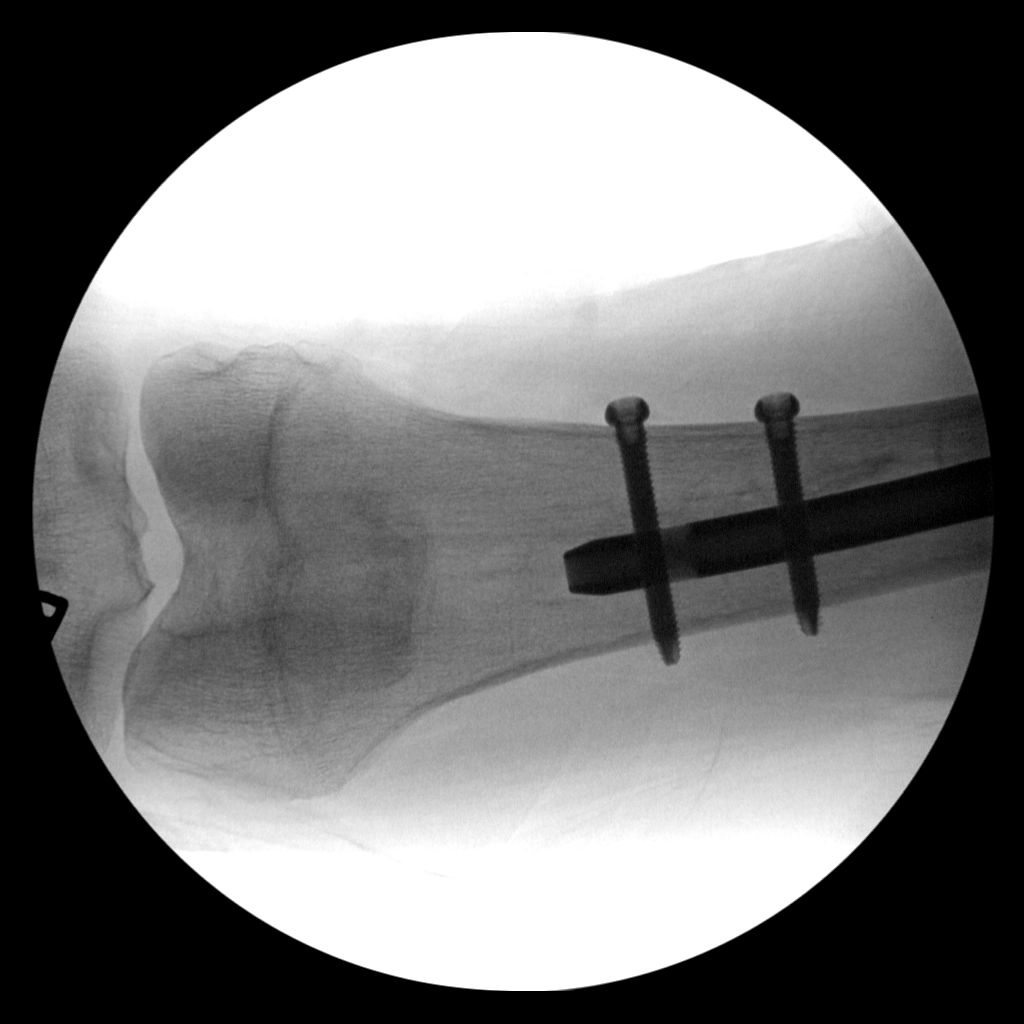
[im 2/4]
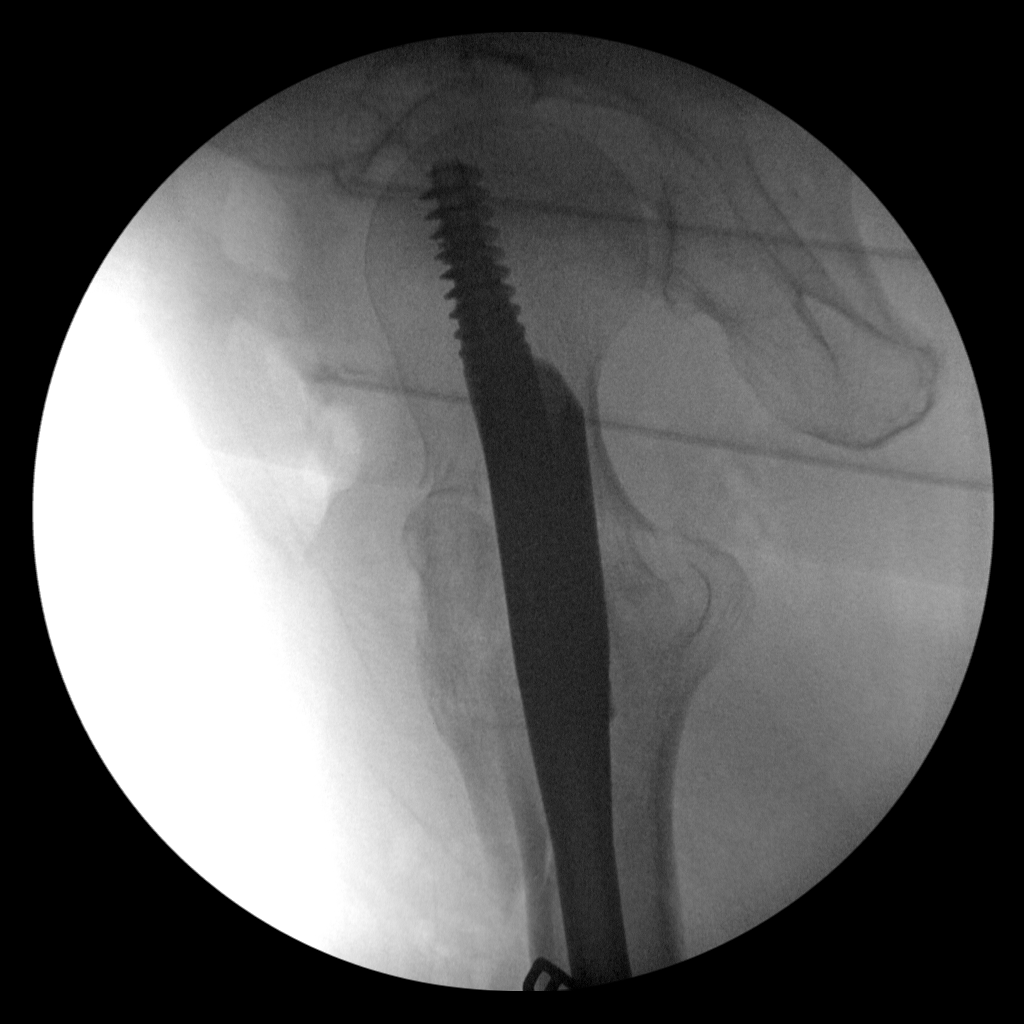
[im 3/4]
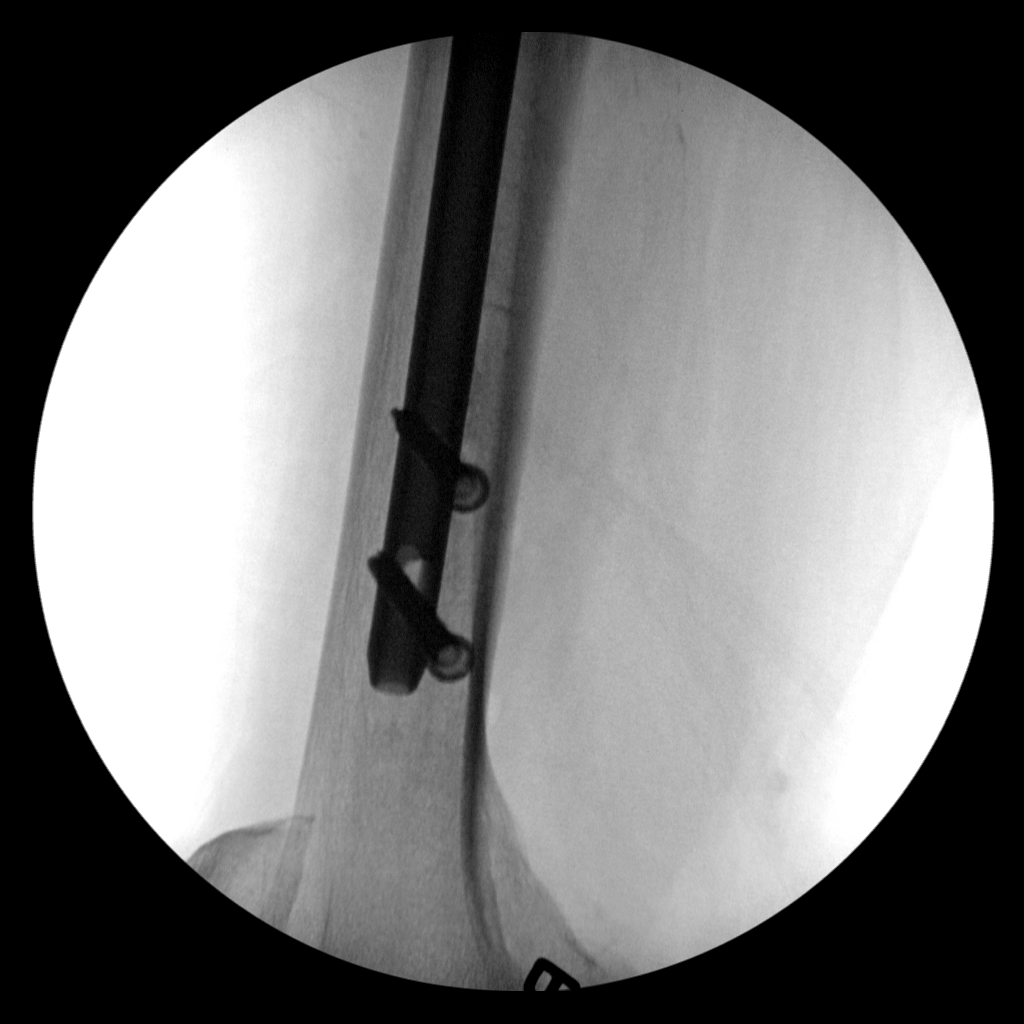
[im 4/4]
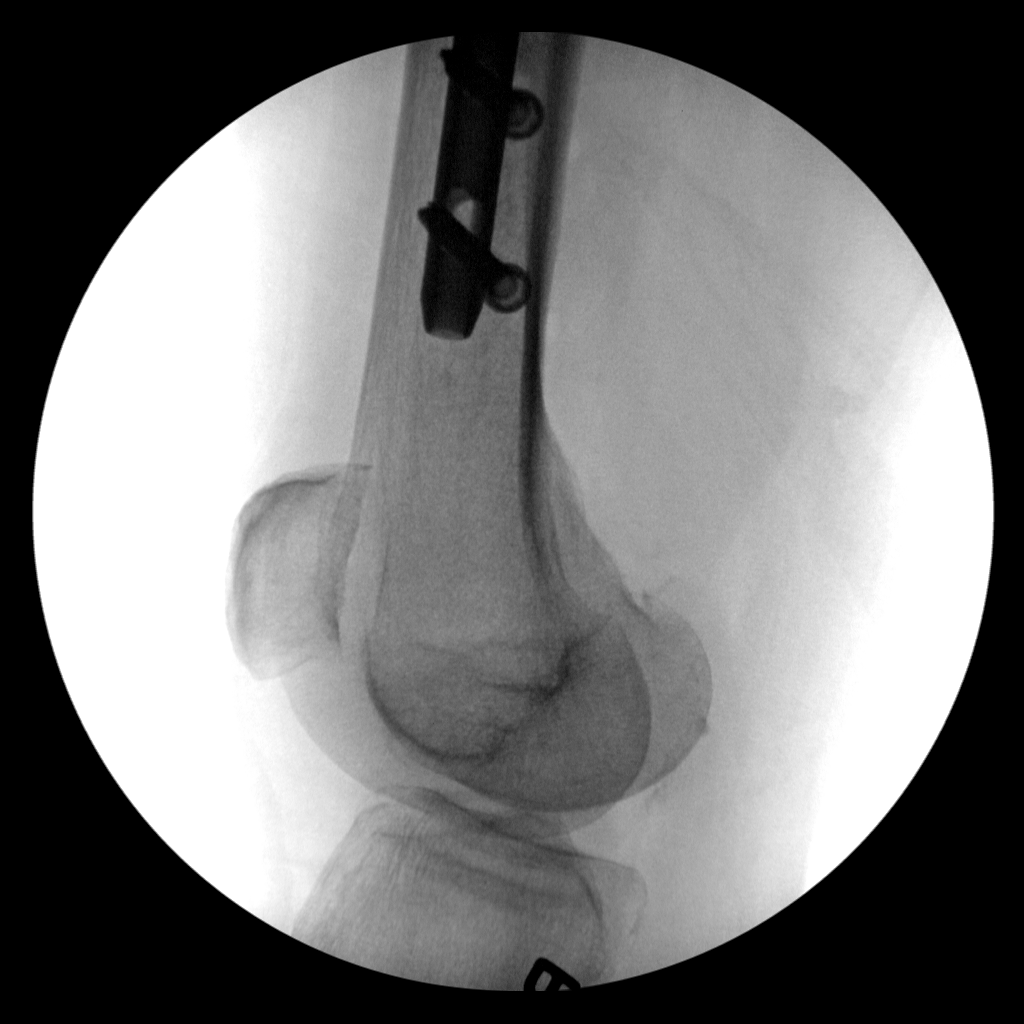

[4 of 4 positions shown; findings below may reference images not displayed]

FINDINGS: 4 spot intraoperative fluoroscopic images of the right femur are
provided for review

Images demonstrate the sequela of intramedullary rod fixation of the
right femur as well as presumed dynamic screw fixation of the right
femoral neck. The distal end of the femoral intramedullary rod is
transfixed with 2 cancellous screws. Alignment appears anatomic
given projection.

There is a minimal amount of expected subcutaneous emphysema about
the operative site. No radiopaque foreign body.
IMPRESSION: Post intramedullary rod fixation of the right femur and femoral neck
without evidence of complication.
